# Patient Record
Sex: Male | Born: 1975
Health system: Southern US, Community
[De-identification: ages and names within clinical notes are randomized; demographics above are authoritative.]

## PROBLEM LIST (undated history)

## (undated) DIAGNOSIS — N5089 Other specified disorders of the male genital organs: Secondary | ICD-10-CM

## (undated) DIAGNOSIS — C6290 Malignant neoplasm of unspecified testis, unspecified whether descended or undescended: Secondary | ICD-10-CM

## (undated) DIAGNOSIS — Z923 Personal history of irradiation: Secondary | ICD-10-CM

## (undated) DIAGNOSIS — K219 Gastro-esophageal reflux disease without esophagitis: Secondary | ICD-10-CM

## (undated) DIAGNOSIS — T7840XA Allergy, unspecified, initial encounter: Secondary | ICD-10-CM

## (undated) HISTORY — DX: Malignant neoplasm of unspecified testis, unspecified whether descended or undescended: C62.90

## (undated) HISTORY — DX: Personal history of irradiation: Z92.3

## (undated) HISTORY — DX: Allergy, unspecified, initial encounter: T78.40XA

## (undated) HISTORY — PX: FRACTURE SURGERY: SHX138

---

## 2007-10-02 ENCOUNTER — Encounter: Admission: RE | Admit: 2007-10-02 | Discharge: 2007-10-02 | Payer: Self-pay | Admitting: Orthopedic Surgery

## 2007-10-03 ENCOUNTER — Ambulatory Visit (HOSPITAL_COMMUNITY): Admission: RE | Admit: 2007-10-03 | Discharge: 2007-10-04 | Payer: Self-pay | Admitting: Orthopedic Surgery

## 2007-10-03 HISTORY — PX: OTHER SURGICAL HISTORY: SHX169

## 2011-01-10 NOTE — Op Note (Signed)
Clifford Davis, Clifford Davis                ACCOUNT NO.:  [id number]   MEDICAL RECORD NO.:  [id number]          PATIENT TYPE:  OIB   LOCATION:  5028                         FACILITY:  [facility]   PHYSICIAN:  Madelynn Done, MD  DATE OF BIRTH:  1976/05/09   DATE OF PROCEDURE:  10/03/2007  DATE OF DISCHARGE:  10/04/2007                               OPERATIVE REPORT   PREOPERATIVE DIAGNOSIS:  Left wrist intraarticular distal radius  fracture four or more fragments.   POSTOPERATIVE DIAGNOSIS:  Left wrist intraarticular distal radius  fracture four or more fragments.   ATTENDING SURGEON:  Sharma Covert IV, MD, who was scrubbed and present  for the entire procedure.   ASSISTANT SURGEON:  None.   PROCEDURE:  1. Open treatment of left intraarticular distal radius fracture four      or more fragments with internal fixation, volar plate.  2. Radiograph three-views left wrist, stress radiography.   SURGICAL IMPLANTS:  Acumed volar distal radius plate with locking pegs  distally and three bicortical screws proximally.   SURGICAL TOURNIQUET TIME:  1 hour and 15 minutes at 250 mmHg.   DRAINS:  One #7 TLS drain.   ANESTHESIA:  General via laryngeal mask airway.   SURGICAL INDICATIONS:  Mr. Nan is a 35 year old right hand dominant  gentleman who fell while at work and sustained a displaced articular  distal radius fracture.  The patient was seen in the office and a  preoperative CT scan was obtained which showed the four or more  fragments and fracture pattern.  After review of the CT scan and  preoperative finding, he elected to undergo the above procedure.  The  risks, benefits and alternatives were discussed in detail with the  patient and a signed informed consent was obtained.   DESCRIPTION OF THE PROCEDURE:  The patient was properly identified in  the preoperative holding area and a permanent mark was made on the left  wrist to indicate the correct operative site.  The patient was  then  brought back to the operating room, placed supine on the anesthesia room  table, general anesthesia was administered via LMA.  A well padded  tourniquet was then placed on sealed with a 1000 drape.  Preoperative  antibiotics were given.  The left upper extremity was then prepped and  draped in normal sterile fashion.  A time-out was called, the correct  site was identified and the procedure was then begun.  Using Esmarch  exsanguination and elevation the tourniquet was then insufflated to 250  mmHg.  A longitudinal incision was then made directly over the FCR  tendon.  Dissection was carried down through the skin and subcutaneous  tissues to identify the FCR.  The FCR was then identified and opened and  released both proximally and distally.  The FPL was then identified and  then through blunt dissection the pronator quadratus elevated and an L-  shaped pronator flap was then created.  This allowed for exposure of the  fracture site.  An open reduction was then performed.  Following open  reduction images were  obtained which confirmed good anatomical position  of the intraarticular fragments.  Following the open reduction the volar  plate was then fixed to the distal radius using the oblong hole  proximally with a bicortical screw.  The 3/5 bicortical screw and the  plate were then adjusted in both the AP & lateral planes for appropriate  alignment of the implant.  After the plate was held in place attention  was then turned distally beginning ulnarly where the ulnar column was  then fixed with locking pegs after the appropriate drill bit and guide.  This was confirmed using the mini C-arm and then the remaining distal  locking pegs were then placed moving from an ulnar to radial direction.  Following placement of the distal fixation, attention was then turned  proximally where two more 3/5 bicortical screws were then placed.  There  was good fixation of all three screws.  Following  the placement of the  implant, final mini C-arm images were obtained which confirmed good  anatomical reduction of the distal radius and plate placement.  Following this the wound was then thoroughly irrigated.  The pronator  quadratus flap was then closed with #2-0 Vicryl suture with good layer  closure of the pronator flap.  The subcutaneous tissues were then closed  with #4-0 Vicryl and the skin closed with a horizontal mattress #4-0  nylon suture.  The drain was then placed along the region of the  pronator and closed over the sutures.  16 mL of 0.5% Marcaine were  infiltrated around the wound for a local field block.  Adaptic dressing,  a sterile compressive dressing and a well padded sugar-tong splint were  then applied.  The patient was then extubated and taken to the recovery  room in good condition.   Intraoperative radiographs, three views of the wrist, were obtained  which did show the volar plate fixation in place, appears to be good  alignment in all three planes.  There does not appear to be any  prominent hardware.   Intraoperative stress testing was then done which did not show any  instability of the distal radial joint after fixation of the distal  radius and no widening of the intercarpal ligaments with stress testing.   POSTOPERATIVE PLAN:  The patient is going to be admitted over night for  antibiotics and pain control, be then discharged in the morning and be  seen back in the office in approximately 12 days for wound check and  suture removal, radiographs at that point and then likely transition to  a cast, likely a total of 4 weeks immobilization in a short-arm cast and  then begin therapy for active motion of the wrists and digits and  forearm.      Madelynn Done, MD  Electronically Signed     FWO/MEDQ  D:  10/03/2007  T:  10/04/2007  Job:  [id number]

## 2011-05-19 LAB — CBC
HCT: 46.7
MCV: 89.5
Platelets: 301
WBC: 10.5

## 2011-05-19 LAB — COMPREHENSIVE METABOLIC PANEL
Albumin: 4.3
BUN: 5 — ABNORMAL LOW
Chloride: 103
Creatinine, Ser: 0.58
GFR calc non Af Amer: >60
Total Bilirubin: 0.9

## 2012-11-28 ENCOUNTER — Ambulatory Visit (HOSPITAL_COMMUNITY)
Admission: RE | Admit: 2012-11-28 | Discharge: 2012-11-28 | Disposition: A | Discharge status: Home / Self Care | Payer: 59 | Source: Ambulatory Visit | Attending: Urology | Admitting: Urology

## 2012-11-28 ENCOUNTER — Other Ambulatory Visit (HOSPITAL_COMMUNITY): Payer: Self-pay | Admitting: Urology

## 2012-11-28 DIAGNOSIS — D4959 Neoplasm of unspecified behavior of other genitourinary organ: Secondary | ICD-10-CM

## 2012-11-28 DIAGNOSIS — C6290 Malignant neoplasm of unspecified testis, unspecified whether descended or undescended: Secondary | ICD-10-CM | POA: Insufficient documentation

## 2012-11-29 ENCOUNTER — Encounter (HOSPITAL_BASED_OUTPATIENT_CLINIC_OR_DEPARTMENT_OTHER): Payer: Self-pay | Admitting: *Deleted

## 2012-11-29 ENCOUNTER — Other Ambulatory Visit: Payer: Self-pay | Admitting: Urology

## 2012-11-29 NOTE — Progress Notes (Signed)
NPO AFTER MN WITH EXCEPTION CLEAR LIQUIDS UNTIL 0730 (NO CREAM/ MILK PRODUCTS). ARRIVES AT 1330. NEEDS HG. WILL TAKE ZANTAC AND ZYRTEC AM OF SURG W/ SIPS OF WATER.

## 2012-12-02 ENCOUNTER — Ambulatory Visit (HOSPITAL_BASED_OUTPATIENT_CLINIC_OR_DEPARTMENT_OTHER): Payer: 59 | Admitting: Anesthesiology

## 2012-12-02 ENCOUNTER — Ambulatory Visit (HOSPITAL_BASED_OUTPATIENT_CLINIC_OR_DEPARTMENT_OTHER)
Admission: RE | Admit: 2012-12-02 | Discharge: 2012-12-02 | Disposition: A | Discharge status: Home / Self Care | Payer: 59 | Source: Ambulatory Visit | Attending: Urology | Admitting: Urology

## 2012-12-02 ENCOUNTER — Encounter (HOSPITAL_BASED_OUTPATIENT_CLINIC_OR_DEPARTMENT_OTHER): Payer: Self-pay | Admitting: Anesthesiology

## 2012-12-02 ENCOUNTER — Encounter (HOSPITAL_BASED_OUTPATIENT_CLINIC_OR_DEPARTMENT_OTHER)
Admission: RE | Disposition: A | Discharge status: Home / Self Care | Payer: Self-pay | Source: Ambulatory Visit | Attending: Urology

## 2012-12-02 ENCOUNTER — Encounter (HOSPITAL_BASED_OUTPATIENT_CLINIC_OR_DEPARTMENT_OTHER): Payer: Self-pay

## 2012-12-02 DIAGNOSIS — C6290 Malignant neoplasm of unspecified testis, unspecified whether descended or undescended: Secondary | ICD-10-CM

## 2012-12-02 DIAGNOSIS — K219 Gastro-esophageal reflux disease without esophagitis: Secondary | ICD-10-CM | POA: Insufficient documentation

## 2012-12-02 DIAGNOSIS — N5089 Other specified disorders of the male genital organs: Secondary | ICD-10-CM

## 2012-12-02 HISTORY — DX: Malignant neoplasm of unspecified testis, unspecified whether descended or undescended: C62.90

## 2012-12-02 HISTORY — DX: Other specified disorders of the male genital organs: N50.89

## 2012-12-02 HISTORY — PX: ORCHIECTOMY: SHX2116

## 2012-12-02 HISTORY — DX: Gastro-esophageal reflux disease without esophagitis: K21.9

## 2012-12-02 LAB — POCT HEMOGLOBIN-HEMACUE: Hemoglobin: 17.7 g/dL — ABNORMAL HIGH (ref 13.0–17.0)

## 2012-12-02 SURGERY — ORCHIECTOMY
Anesthesia: General | Site: Groin | Laterality: Left | Wound class: Clean

## 2012-12-02 MED ORDER — PROPOFOL 10 MG/ML IV BOLUS
INTRAVENOUS | Status: DC | PRN
  Administered 2012-12-02: 20 mg via INTRAVENOUS
  Administered 2012-12-02: 300 mg via INTRAVENOUS
  Administered 2012-12-02: 20 mg via INTRAVENOUS

## 2012-12-02 MED ORDER — FENTANYL CITRATE 0.05 MG/ML IJ SOLN
25.0000 ug | INTRAMUSCULAR | Status: DC | PRN
  Administered 2012-12-02 (×4): 25 ug via INTRAVENOUS
  Filled 2012-12-02: qty 1

## 2012-12-02 MED ORDER — BUPIVACAINE HCL (PF) 0.25 % IJ SOLN
INTRAMUSCULAR | Status: DC | PRN
  Administered 2012-12-02: 20 mL

## 2012-12-02 MED ORDER — PROMETHAZINE HCL 25 MG/ML IJ SOLN
6.2500 mg | INTRAMUSCULAR | Status: DC | PRN
  Filled 2012-12-02: qty 1

## 2012-12-02 MED ORDER — MEPERIDINE HCL 25 MG/ML IJ SOLN
6.2500 mg | INTRAMUSCULAR | Status: DC | PRN
  Filled 2012-12-02: qty 1

## 2012-12-02 MED ORDER — DEXAMETHASONE SODIUM PHOSPHATE 4 MG/ML IJ SOLN
INTRAMUSCULAR | Status: DC | PRN
  Administered 2012-12-02: 10 mg via INTRAVENOUS

## 2012-12-02 MED ORDER — LACTATED RINGERS IV SOLN
INTRAVENOUS | Status: DC
  Filled 2012-12-02: qty 1000

## 2012-12-02 MED ORDER — SENNOSIDES-DOCUSATE SODIUM 8.6-50 MG PO TABS: 1.0000 | ORAL_TABLET | Freq: Two times a day (BID) | ORAL | Status: DC

## 2012-12-02 MED ORDER — LACTATED RINGERS IV SOLN
INTRAVENOUS | Status: DC | PRN
  Administered 2012-12-02: 14:00:00 via INTRAVENOUS

## 2012-12-02 MED ORDER — LIDOCAINE HCL (CARDIAC) 20 MG/ML IV SOLN
INTRAVENOUS | Status: DC | PRN
  Administered 2012-12-02: 75 mg via INTRAVENOUS

## 2012-12-02 MED ORDER — CEFAZOLIN SODIUM-DEXTROSE 2-3 GM-% IV SOLR
INTRAVENOUS | Status: DC | PRN
  Administered 2012-12-02: 2 g via INTRAVENOUS

## 2012-12-02 MED ORDER — OXYCODONE-ACETAMINOPHEN 5-325 MG PO TABS: 1.0000 | ORAL_TABLET | ORAL | Status: DC | PRN

## 2012-12-02 MED ORDER — ONDANSETRON HCL 4 MG/2ML IJ SOLN
INTRAMUSCULAR | Status: DC | PRN
  Administered 2012-12-02: 4 mg via INTRAVENOUS

## 2012-12-02 MED ORDER — LACTATED RINGERS IV SOLN
INTRAVENOUS | Status: DC
  Administered 2012-12-02 (×2): via INTRAVENOUS
  Administered 2012-12-02: 100 mL/h via INTRAVENOUS
  Administered 2012-12-02: 14:00:00 via INTRAVENOUS
  Filled 2012-12-02: qty 1000

## 2012-12-02 MED ORDER — OXYCODONE HCL 5 MG PO TABS
10.0000 mg | ORAL_TABLET | Freq: Once | ORAL | Status: AC
  Administered 2012-12-02: 10 mg via ORAL
  Filled 2012-12-02: qty 2

## 2012-12-02 MED ORDER — ACETAMINOPHEN 10 MG/ML IV SOLN
INTRAVENOUS | Status: DC | PRN
  Administered 2012-12-02: 1000 mg via INTRAVENOUS

## 2012-12-02 MED ORDER — KETOROLAC TROMETHAMINE 30 MG/ML IJ SOLN
INTRAMUSCULAR | Status: DC | PRN
  Administered 2012-12-02: 30 mg via INTRAVENOUS

## 2012-12-02 MED ORDER — MIDAZOLAM HCL 5 MG/5ML IJ SOLN
INTRAMUSCULAR | Status: DC | PRN
  Administered 2012-12-02: 2 mg via INTRAVENOUS

## 2012-12-02 MED ORDER — CEFAZOLIN SODIUM-DEXTROSE 2-3 GM-% IV SOLR
2.0000 g | INTRAVENOUS | Status: DC
  Filled 2012-12-02: qty 50

## 2012-12-02 MED ORDER — FENTANYL CITRATE 0.05 MG/ML IJ SOLN
INTRAMUSCULAR | Status: DC | PRN
  Administered 2012-12-02: 50 ug via INTRAVENOUS
  Administered 2012-12-02 (×3): 25 ug via INTRAVENOUS
  Administered 2012-12-02: 50 ug via INTRAVENOUS
  Administered 2012-12-02 (×7): 25 ug via INTRAVENOUS
  Administered 2012-12-02: 50 ug via INTRAVENOUS

## 2012-12-02 SURGICAL SUPPLY — 42 items
BANDAGE CO FLEX L/F 2IN X 5YD (GAUZE/BANDAGES/DRESSINGS) ×2 IMPLANT
BANDAGE CONFORM 3  STR LF (GAUZE/BANDAGES/DRESSINGS) ×2 IMPLANT
BANDAGE GAUZE ELAST BULKY 4 IN (GAUZE/BANDAGES/DRESSINGS) ×2 IMPLANT
BLADE HEX COATED 2.75 (ELECTRODE) ×2 IMPLANT
BLADE SURG 10 STRL SS (BLADE) IMPLANT
BLADE SURG 15 STRL LF DISP TIS (BLADE) ×1 IMPLANT
BLADE SURG 15 STRL SS (BLADE) ×1
CLOTH BEACON ORANGE TIMEOUT ST (SAFETY) ×2 IMPLANT
COVER MAYO STAND STRL (DRAPES) ×2 IMPLANT
COVER TABLE BACK 60X90 (DRAPES) ×2 IMPLANT
DERMABOND ADVANCED (GAUZE/BANDAGES/DRESSINGS) ×1
DERMABOND ADVANCED .7 DNX12 (GAUZE/BANDAGES/DRESSINGS) ×1 IMPLANT
DRAIN PENROSE 18X1/2 LTX STRL (DRAIN) ×2 IMPLANT
DRAIN PENROSE 18X1/4 LTX STRL (WOUND CARE) IMPLANT
DRAPE PED LAPAROTOMY (DRAPES) ×2 IMPLANT
ELECT REM PT RETURN 9FT ADLT (ELECTROSURGICAL) ×2
ELECTRODE REM PT RTRN 9FT ADLT (ELECTROSURGICAL) ×1 IMPLANT
GLOVE BIO SURGEON STRL SZ7 (GLOVE) ×8 IMPLANT
GLOVE BIOGEL PI IND STRL 7.5 (GLOVE) ×1 IMPLANT
GLOVE BIOGEL PI INDICATOR 7.5 (GLOVE) ×1
GLOVE ECLIPSE 7.5 STRL STRAW (GLOVE) ×2 IMPLANT
GOWN PREVENTION PLUS LG XLONG (DISPOSABLE) ×2 IMPLANT
GOWN PREVENTION PLUS XLARGE (GOWN DISPOSABLE) ×6 IMPLANT
NEEDLE HYPO 22GX1.5 SAFETY (NEEDLE) ×2 IMPLANT
NS IRRIG 500ML POUR BTL (IV SOLUTION) ×2 IMPLANT
PACK BASIN DAY SURGERY FS (CUSTOM PROCEDURE TRAY) ×2 IMPLANT
PENCIL BUTTON HOLSTER BLD 10FT (ELECTRODE) ×2 IMPLANT
SPONGE LAP 4X18 X RAY DECT (DISPOSABLE) IMPLANT
SUPPORT SCROTAL LG STRP (MISCELLANEOUS) ×2 IMPLANT
SUT CHROMIC 2 0 SH (SUTURE) IMPLANT
SUT CHROMIC 3 0 SH 27 (SUTURE) ×2 IMPLANT
SUT MNCRL AB 4-0 PS2 18 (SUTURE) ×2 IMPLANT
SUT SILK 0 TIES 10X30 (SUTURE) ×2 IMPLANT
SUT SILK 2 0 SH CR/8 (SUTURE) ×2 IMPLANT
SUT VIC AB 2-0 SH 27 (SUTURE) ×1
SUT VIC AB 2-0 SH 27XBRD (SUTURE) ×1 IMPLANT
SUT VIC AB 4-0 SH 27 (SUTURE) ×1
SUT VIC AB 4-0 SH 27XANBCTRL (SUTURE) ×1 IMPLANT
SYR CONTROL 10ML LL (SYRINGE) ×2 IMPLANT
TOWEL OR 17X24 6PK STRL BLUE (TOWEL DISPOSABLE) ×4 IMPLANT
TRAY DSU PREP LF (CUSTOM PROCEDURE TRAY) ×2 IMPLANT
WATER STERILE IRR 500ML POUR (IV SOLUTION) IMPLANT

## 2012-12-02 NOTE — Anesthesia Procedure Notes (Signed)
Procedure Name: LMA Insertion Date/Time: 12/02/2012 2:31 PM Performed by: Fran Lowes Pre-anesthesia Checklist: Patient identified, Emergency Drugs available, Suction available and Patient being monitored Patient Re-evaluated:Patient Re-evaluated prior to inductionOxygen Delivery Method: Circle System Utilized Preoxygenation: Pre-oxygenation with 100% oxygen Intubation Type: IV induction Ventilation: Mask ventilation without difficulty LMA: LMA inserted LMA Size: 4.0 Number of attempts: 1 Airway Equipment and Method: bite block Placement Confirmation: positive ETCO2 Tube secured with: Tape Dental Injury: Teeth and Oropharynx as per pre-operative assessment

## 2012-12-02 NOTE — Transfer of Care (Signed)
Immediate Anesthesia Transfer of Care Note  Patient: Clifford Davis  Procedure(s) Performed: Procedure(s) (LRB): ORCHIECTOMY (Left)  Patient Location: Patient transported to PACU with oxygen via face mask at 4 Liters / Min  Anesthesia Type: General  Level of Consciousness: awake and alert   Airway & Oxygen Therapy: Patient Spontanous Breathing and Patient connected to face mask oxygen  Post-op Assessment: Report given to PACU RN and Post -op Vital signs reviewed and stable  Post vital signs: Reviewed and stable  Dentition: Teeth and oropharynx remain in pre-op condition  Complications: No apparent anesthesia complications

## 2012-12-02 NOTE — Anesthesia Postprocedure Evaluation (Signed)
  Anesthesia Post-op Note  Patient: Clifford Davis  Procedure(s) Performed: Procedure(s) (LRB): ORCHIECTOMY (Left)  Patient Location: PACU  Anesthesia Type: General  Level of Consciousness: awake and alert   Airway and Oxygen Therapy: Patient Spontanous Breathing  Post-op Pain: mild  Post-op Assessment: Post-op Vital signs reviewed, Patient's Cardiovascular Status Stable, Respiratory Function Stable, Patent Airway and No signs of Nausea or vomiting  Last Vitals:  Filed Vitals:   12/02/12 1700  BP: 124/81  Pulse: 91  Temp:   Resp: 16    Post-op Vital Signs: stable   Complications: No apparent anesthesia complications

## 2012-12-02 NOTE — Anesthesia Preprocedure Evaluation (Signed)
Anesthesia Evaluation  Patient identified by MRN, date of birth, ID band Patient awake    Reviewed: Allergy & Precautions, H&P , NPO status , Patient's Chart, lab work & pertinent test results  Airway Mallampati: II TM Distance: >3 FB Neck ROM: Full    Dental no notable dental hx.    Pulmonary neg pulmonary ROS,  breath sounds clear to auscultation  Pulmonary exam normal       Cardiovascular negative cardio ROS  Rhythm:Regular Rate:Normal     Neuro/Psych negative neurological ROS  negative psych ROS   GI/Hepatic negative GI ROS, Neg liver ROS, GERD-  Medicated and Controlled,  Endo/Other  negative endocrine ROS  Renal/GU negative Renal ROS  negative genitourinary   Musculoskeletal negative musculoskeletal ROS (+)   Abdominal   Peds negative pediatric ROS (+)  Hematology negative hematology ROS (+)   Anesthesia Other Findings   Reproductive/Obstetrics negative OB ROS                           Anesthesia Physical Anesthesia Plan  ASA: I  Anesthesia Plan: General   Post-op Pain Management:    Induction: Intravenous  Airway Management Planned: LMA  Additional Equipment:   Intra-op Plan:   Post-operative Plan:   Informed Consent: I have reviewed the patients History and Physical, chart, labs and discussed the procedure including the risks, benefits and alternatives for the proposed anesthesia with the patient or authorized representative who has indicated his/her understanding and acceptance.   Dental advisory given  Plan Discussed with: CRNA  Anesthesia Plan Comments:         Anesthesia Quick Evaluation

## 2012-12-02 NOTE — Op Note (Signed)
Urology Operative Report  Date of Procedure: 12/02/12  Surgeon: Natalia Leatherwood, MD Assistant: None  Preoperative Diagnosis: Left testicular mass Postoperative Diagnosis:  Same  Procedure(s): Left inguinal radical orchiectomy  Estimated blood loss: Minimal  Specimen: Left testicle with spermatic cord  Drains: None  Complications: None  Findings: Large left testicular mass  History of present illness: 37 year old male presented to clinic with a large left testicular mass. He was noted to have elevated hCG and LDH. Ultrasound revealed a heterogenous left testicular mass concerning for cancer. He presents today for left inguinal radical orchiectomy. Staging CT reveals enlarged retroperitoneal lymph nodes.   Procedure in detail: After informed consent was obtained, the patient was taken to the operating room. They were placed in the supine position. SCDs were turned on and in place. IV antibiotics were infused, and general anesthesia was induced. A timeout was performed in which the correct patient, surgical site, and procedure were identified and agreed upon by the team.  The patient was placed in a supine position, making sure to pad all pertinent neurovascular pressure points. The hair was removed from the genitals which were prepped and draped in the usual sterile fashion.  I then was able to locate the pubic tubercle on the left. This was marked with a marking pen. I then placed my finger into the inguinal canal and located the external inguinal ring. A horizontal incision was made approximately 2 cm above the inguinal ring. Dissection was carried down to the fascia using Bovie electrocautery. The fashion was identified I was able to place the back end of a DeBakey forcep into the external canal and I was able to incise the external ring with a scalpel. The ilioinguinal nerve was identified and brushed back medially and the ring was opened further to allow exposure of the left  spermatic cord up to the internal ring. Blunt dissection was then used to dissect completely around the spermatic cord and then a Penrose drain was wrapped around the cord for tourniquet and clamped. Dissection was then carried out inferiorly bluntly to the scrotum. I was able to place gentle pressure on the left scrotum and delivered the testicle through the incision. I then gently swept the tissue between the scrotal wall and the testicle away and identified the gubernaculum. The gubernaculum was ligated with 0 silk ties and then divided with Bovie electrocautery. There was good hemostasis. The testicle was then placed on traction superiorly and a high ligation of the left spermatic cord was achieved by first tying a 0 silk tie around the cord. I then clamped the cord with a hemostat and placed another 0 silk tie proximal to the hemostat. A third free tie was used and finally a 2-0 silk stick tie was used as well. A 0 silk suture was then placed distally and the spermatic cord was divided. The tails to the ties were left long and then the clamp was removed. There was good hemostasis. All details were cut short except for one and then this was placed back in the inguinal canal and pushed back into the retroperitoneum. The external oblique was reapproximated with interrupted 2-0 Vicryl sutures making sure not to incorporate the ilioinguinal nerve. After this was done the scrotum was again inverted to ensure good hemostasis, and there was good hemostasis. The wound was copiously irrigated with sterile normal saline. Next Scarpa's and Camper's fascia was reapproximated using interrupted 2-0 Vicryl sutures. Local injection with lidocaine 20 cc was injected into the skin and incision.  The wound was again irrigated with sterile normal saline. I then closed the skin with a running 4-0 Monocryl. I then placed Dermabond over the incision. A Kling wrap was placed around the scrotum to help maintain hemostasis and prevent  potential space of the left scrotum. This was not placed very tightly in order to prevent ischemia of the left testis. This concluded the procedure. Anesthesia was reversed, and the patient was taken to the PACU in stable condition.  He will followup with me later this week for review of pathology and treatment options.

## 2012-12-02 NOTE — H&P (Signed)
Urology History and Physical Exam  CC: Left testis mass  HPI: 37 year old male presents today with a left testicular mass concerning for testicular cancer. This began acutely on 11/15/12. Scrotal ultrasound 11/19/12 revealed a heterogenous left testicle. There were no concerning findings the portion but more concerning for testicular mass. He had been started on ciprofloxacin and then referred to me. This was not associated with an intentional weight loss. He states he had had some mid lower back pain for 6-8 months. A change in appetite. No urinary symptoms. Laboratory analysis revealed an elevated LDH and hCG; if he was normal. Physical exam today revealed a left testicular mass which was approximately 8-9 cm in size. Staging CT was obtained of the abdomen and pelvis which revealed a large left retroperitoneal lymph node approximate 6 cm in size; there were no other signs of metastatic disease. We reviewed the entire CT report today and I provided him with a copy. A chest x-ray was negative. I shared the findings of the CT with the patient today and provided him a copy of the report. He presents today for left inguinal radical orchiectomy. We have discussed the risks, benefits, alternatives, and likelihood of achieving goals.  PMH: Past Medical History  Diagnosis Date  . Testis mass     LEFT  . GERD (gastroesophageal reflux disease)     PSH: Past Surgical History  Procedure Laterality Date  . Orif left wrist distal radial fx's  10-03-2007    Allergies: No Known Allergies  Medications: No prescriptions prior to admission     Social History: History   Social History  . Marital Status: Married    Spouse Name: N/A    Number of Children: N/A  . Years of Education: N/A   Occupational History  . Not on file.   Social History Main Topics  . Smoking status: Not on file  . Smokeless tobacco: Never Used  . Alcohol Use: 1.8 oz/week    3 Cans of beer per week  . Drug Use: No  .  Sexually Active: Not on file   Other Topics Concern  . Not on file   Social History Narrative  . No narrative on file    Family History: History reviewed. No pertinent family history.  Review of Systems: Positive: None Negative: Chest pain, SOB, or fever..  A further 10 point review of systems was negative except what is listed in the HPI.  Physical Exam: Filed Vitals:   12/02/12 1353  BP: 148/84  Pulse: 98  Temp: 97 F (36.1 C)  Resp: 18    General: No acute distress.  Awake. Head:  Normocephalic.  Atraumatic. ENT:  EOMI.  Mucous membranes moist Neck:  Supple.  No lymphadenopathy. CV:  S1 present. S2 present. Regular rate. Pulmonary: Equal effort bilaterally.  Clear to auscultation bilaterally. Abdomen: Soft.  Non- tender to palpation. Skin:  Normal turgor.  No visible rash. Extremity: No gross deformity of bilateral upper extremities.  No gross deformity of    bilateral lower extremities. Neurologic: Alert. Appropriate mood.  Urethra: No Foley catheter in place.  Orthotopic meatus. Testicles: Descended bilaterally.  Negative right testis mass. Large left testis mass.   Studies:  No results found for this basename: HGB, WBC, PLT,  in the last 72 hours  No results found for this basename: NA, K, CL, CO2, BUN, CREATININE, CALCIUM, MAGNESIUM, GFRNONAA, GFRAA,  in the last 72 hours   No results found for this basename: PT, INR, APTT,  in  the last 72 hours   No components found with this basename: ABG,     Assessment:  Left testis mass concerning for testis cancer.  Plan: To OR for left inguinal radical orchiectomy.

## 2012-12-03 ENCOUNTER — Encounter (HOSPITAL_BASED_OUTPATIENT_CLINIC_OR_DEPARTMENT_OTHER): Payer: Self-pay | Admitting: Urology

## 2012-12-11 ENCOUNTER — Telehealth: Payer: Self-pay | Admitting: Oncology

## 2012-12-11 NOTE — Telephone Encounter (Signed)
S/W PT IN RE NP APPT 4/23 @ 1:30 W/DR. SHADAD REFERRING DR. Margarita Grizzle DX- TESTICULAR SEMINOMA WELCOME PACKET MAILED.

## 2012-12-11 NOTE — Telephone Encounter (Signed)
LVOM FOR PT TO RETURN CALL IN RE TO APPT.  °

## 2012-12-12 ENCOUNTER — Telehealth: Payer: Self-pay | Admitting: Oncology

## 2012-12-12 NOTE — Telephone Encounter (Signed)
C/D 12/12/12 for appt. 12/18/12

## 2012-12-13 ENCOUNTER — Other Ambulatory Visit: Payer: Self-pay | Admitting: Oncology

## 2012-12-13 DIAGNOSIS — C6290 Malignant neoplasm of unspecified testis, unspecified whether descended or undescended: Secondary | ICD-10-CM

## 2012-12-18 ENCOUNTER — Other Ambulatory Visit (HOSPITAL_BASED_OUTPATIENT_CLINIC_OR_DEPARTMENT_OTHER): Payer: 59

## 2012-12-18 ENCOUNTER — Ambulatory Visit: Payer: 59

## 2012-12-18 ENCOUNTER — Ambulatory Visit (HOSPITAL_BASED_OUTPATIENT_CLINIC_OR_DEPARTMENT_OTHER): Payer: 59 | Admitting: Oncology

## 2012-12-18 ENCOUNTER — Encounter: Payer: Self-pay | Admitting: Oncology

## 2012-12-18 ENCOUNTER — Telehealth: Payer: Self-pay | Admitting: Oncology

## 2012-12-18 VITALS — BP 140/89 | HR 102 | Temp 98.3°F | Resp 18 | Ht 73.0 in | Wt 212.4 lb

## 2012-12-18 DIAGNOSIS — C6290 Malignant neoplasm of unspecified testis, unspecified whether descended or undescended: Secondary | ICD-10-CM

## 2012-12-18 LAB — CBC WITH DIFFERENTIAL/PLATELET
BASO%: 0.7 % (ref 0.0–2.0)
Basophils Absolute: 0 10*3/uL (ref 0.0–0.1)
Eosinophils Absolute: 0.2 10*3/uL (ref 0.0–0.5)
HCT: 46.2 % (ref 38.4–49.9)
HGB: 16 g/dL (ref 13.0–17.1)
MONO#: 0.4 10*3/uL (ref 0.1–0.9)
NEUT#: 3.7 10*3/uL (ref 1.5–6.5)
NEUT%: 55.8 % (ref 39.0–75.0)
WBC: 6.6 10*3/uL (ref 4.0–10.3)
lymph#: 2.3 10*3/uL (ref 0.9–3.3)

## 2012-12-18 LAB — COMPREHENSIVE METABOLIC PANEL (CC13)
ALT: 32 U/L (ref 0–55)
BUN: 12.5 mg/dL (ref 7.0–26.0)
CO2: 27 mEq/L (ref 22–29)
Calcium: 9.9 mg/dL (ref 8.4–10.4)
Chloride: 105 mEq/L (ref 98–107)
Creatinine: 1 mg/dL (ref 0.7–1.3)
Glucose: 97 mg/dl (ref 70–99)

## 2012-12-18 NOTE — Progress Notes (Signed)
Note dictated

## 2012-12-18 NOTE — Progress Notes (Signed)
Checked in new pt with no financial concerns. °

## 2012-12-18 NOTE — Telephone Encounter (Signed)
gv and printed appt sched and avs for pt.Marland KitchenMarland KitchenPt scheduled with Dr. Roselind Messier on 5.1 @ 2:00pm

## 2012-12-19 NOTE — Progress Notes (Signed)
CC:   Natalia Leatherwood, MD  REASON FOR CONSULTATION:  Testicular cancer.  HISTORY OF PRESENT ILLNESS:  Mr. Weese is a pleasant 37 year old gentleman currently of [place] where he lived the majority of his life.  He is a gentleman without any significant past medical history. He does have some seasonal allergies, but for the most part, really is rather healthy.  He had been doing very well until he started developing a left testicular mass dating back to March 2014.  Felt it was the painless in nature and not associated with any other symptoms.  He subsequently was seen at urgent care and was referred to [facility].  He got an ultrasound on March 25th which revealed a heterogeneous left testicle without increased blood flow, not concerning for torsion but definitely suspicious for a malignancy.  At that time, he was evaluated by Dr. Jacquelyne Balint and subsequently referred to Dr. Margarita Grizzle.  At that time, tumor markers were drawn and an LDH was elevated at 372, beta hCG was 16, both elevated.  He subsequently underwent a left inguinal radical orchiectomy that was done on 12/02/2012 and he tolerated it very well.  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  His spermatic cord and surgical margins were negative.  Focal lymphovascular invasion was present and the clinical staging was T2 NX at that time.  CT scan images of the abdomen and pelvis done on 11/29/2012 which showed a large heterogeneous left scrotal mass, that was before his orchiectomy, and also noted a conglomerate of enlarged retroperitoneal lymph node on the left inferior to the left renal vein measuring 3.1 x 2.2 cm in transverse image.  There was no suprarenal adenopathy, both kidneys were normal, the rest of the scan was otherwise unremarkable.  He did also have a CT scan of the chest.  CT scan of the chest was done on 12/12/2012, showed no evidence of  any acute finding, no masses or adenopathy.  Repeat tumor markers done on 12/12/2012 showed his LDH had normalized at 187, also his beta hCG down to 4.9, which is within normal range.  Alpha fetoprotein was normal at 4.1.  Based on these findings, patient referred to me for further evaluation. Clinically, Mr. Temme currently is asymptomatic.  He is regaining a lot of his activities of daily living.  He has not returned to work at this time.  He has really not been able to exert himself fully, but able to perform most activities of daily living.  He is not reporting any abdominal pain, is not reporting any constipation, not reporting any genitourinary complaints.  He does report some back discomfort that has been chronic and probably related to his job.  REVIEW OF SYSTEMS:  Does not report any headaches, blurry vision, double vision.  Does not report any motor or sensory neuropathy.  Does not report any alteration in mental status.  Does not report any psychiatric issues, depression.  Does not report any fever, chills, sweats.  Does not report any cough, hemoptysis, hematemesis.  No nausea or vomiting. Does not report any abdominal pain, hematochezia, melena, or genitourinary complaints.  Rest of review of systems is unremarkable.  PAST MEDICAL HISTORY:  Really unremarkable.  No history of hypertension, diabetes, coronary artery disease, or any colitis or inflammatory bowel disease.  He has history of seasonal allergies as well as GERD.  MEDICATION:  He takes Zyrtec as needed daily.  Also takes ranitidine.  ALLERGIES:  None.  FAMILY HISTORY:  His father is on cholesterol medicine.  His mother has asthma.  He has a grandfather who had lung cancer after smoking.  He had an aunt who had stomach cancer.  SOCIAL HISTORY:  He is married.  Does not have any children.  Denied any alcohol or tobacco abuse at this time.  PHYSICAL EXAMINATION:  General:  Alert, awake, a pleasant  gentleman, appeared in no active distress.  Vitals:  Blood pressure of 140/89, pulse is 102, respirations 18, temperature is 98, weighs 212 pounds. ECOG performance status is 0.  HEENT:  Head is normocephalic, atraumatic.  Pupils equal and round, reactive to light.  Oral mucosa moist and pink.  Neck:  Supple.  No lymphadenopathy.  Heart:  Regular rate and rhythm, S1 and S2.  Lungs:  Clear to auscultation.  Abdomen: Soft, nontender.  No hepatosplenomegaly.  Extremities:  No clubbing, cyanosis, or edema.  Neurological:  Intact motor, sensory, and deep tendon reflexes.  LABORATORY DATA:  Showed a hemoglobin of 16, white cell count of 6.6, platelet count of 366.  He has normal liver function tests and electrolytes.  Tumor markers were discussed in the history of present illness.  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  He had presented with a left testicular mass and underwent a radical orchiectomy on 12/02/2012 and CT scans did show an enlarged conglomerate of lymph glands, largest measuring 3.1 x 2.2, just inferior to the left renal vein.  No evidence of any distant metastasis at this time.  I had a lengthy discussion today with Mr. Cutsforth discussing the natural course of testicular cancer, more specifically stage II non- bulky pure seminoma.  I talked to him about the treatment options at this point.  As it stands right now with non-bulky stage II seminoma, radiation therapy to the area is the recommended approach.  I discussed with him the role of systemic chemotherapy.  At this point, really the only effective chemotherapy for stage II seminoma is combination chemotherapy utilizing etoposide and cisplatin for a total of 4 cycles, or etoposide and cisplatin with bleomycin for 3 cycles.  I discussed with him the risks and benefits of this chemotherapy, the logistics associated  with it, and the toxicity associated with it at this time, and at this point, it is felt as long as he does not have bulky disease this combination of chemotherapy, given its toxicity, would be reserved for relapses or for people that would not undergo radiation therapy.  I have explained to him briefly the logistics associated with the administration of radiotherapy that include it will be daily fractions at this point for a period of multiple weeks.  Complications associated with that compared to systemic chemotherapy were outlined.  At this point, I feel that if radiation therapy is doable, I think that would be my first option for him and systemic chemotherapy would be used as salvage.  I quoted him a 90% overall cure rate regardless whichever way he decided to go.  I also explained to him after radiation therapy, or chemotherapy if elected to decline radiation therapy, he will need active surveillance.  I will set him up with radiation therapy followup and evaluation, as well as a followup with me to initiate surveillance plan after he completes the radiation therapy at this time.  All his questions were answered  today.    ______________________________ Benjiman Core, M.D. FNS/MEDQ  D:  12/18/2012  T:  12/19/2012  Job:  [id number]

## 2012-12-21 LAB — AFP TUMOR MARKER: AFP-Tumor Marker: 2.9 ng/mL (ref 0.0–8.0)

## 2012-12-26 ENCOUNTER — Encounter: Payer: Self-pay | Admitting: Radiation Oncology

## 2012-12-26 ENCOUNTER — Ambulatory Visit: Payer: 59

## 2012-12-26 ENCOUNTER — Ambulatory Visit
Admission: RE | Admit: 2012-12-26 | Discharge: 2012-12-26 | Disposition: A | Discharge status: Home / Self Care | Payer: 59 | Source: Ambulatory Visit | Attending: Radiation Oncology | Admitting: Radiation Oncology

## 2012-12-26 ENCOUNTER — Ambulatory Visit: Payer: 59 | Admitting: Radiation Oncology

## 2012-12-26 VITALS — BP 133/71 | HR 98 | Temp 98.8°F | Ht 73.0 in | Wt 213.8 lb

## 2012-12-26 DIAGNOSIS — C6290 Malignant neoplasm of unspecified testis, unspecified whether descended or undescended: Secondary | ICD-10-CM | POA: Insufficient documentation

## 2012-12-26 DIAGNOSIS — C6292 Malignant neoplasm of left testis, unspecified whether descended or undescended: Secondary | ICD-10-CM

## 2012-12-26 NOTE — Progress Notes (Signed)
Radiation Oncology         [phone] ________________________________  Initial outpatient Consultation  Name: Clifford Davis MRN: [id number]  Date: 12/26/2012  DOB: [birth year]  CC:No PCP Per Patient  Benjiman Core, MD ,Natalia Leatherwood, MD  REFERRING PHYSICIAN: Benjiman Core, MD  DIAGNOSIS: Stage II-B (T2, N2, M0) testicular seminoma  HISTORY OF PRESENT ILLNESS::Clifford Davis is a 37 y.o. male who is seen out of the courtesy of Dr. Clelia Croft for an opinion concerning radiation therapy as part of management of patient's recently diagnosed testicular seminoma. Patient presented in March of this year swelling of his left testicle noted with showering. He was seen at urgent care and subsequently referred to [facility]. Ultrasound of the left testicle revealed a heterogeneous mass. Preoperative CT scan of the abdomen and pelvis revealed a conglomerate of enlarged retroperitoneal lymph nodes on the left, just inferior to the left renal vein. This area measured 3.1 x 2.2 cm. Other than the testicular mass no other findings were noted on the CT scan of the abdomen and pelvis. The patient at that time was found to have an  LDH of 372 and a beta hCG of 16, both elevated.  The patient proceeded to undergo a left inguinal radical orchiectomy on 12/02/2012. Pathology from the patient's surgery revealed a seminoma with necrosis. The tumor measured 8.5 cm and was limited to the testis. The resection margins were clear. There was focal lymphovascular space invasion noted. Pathologic staging was pT2, NX.  The patient has done well since his surgery.. A postoperative CT scan of the chest was performed which showed no evidence of adenopathy.  The patient was subsequently referred to Dr. Clelia Croft in medical oncology who presented options for management to the patient. Patient is now seen in radiation oncology for consideration for treatment and discussion of treatment options.   PREVIOUS RADIATION THERAPY:  No  PAST MEDICAL HISTORY:  has a past medical history of Testis mass; GERD (gastroesophageal reflux disease); Testicular carcinoma (12/02/2012); and Allergy.    PAST SURGICAL HISTORY: Past Surgical History  Procedure Laterality Date  . Orif left wrist distal radial fx's  10-03-2007  . Orchiectomy Left 12/02/2012    Procedure: ORCHIECTOMY;  Surgeon: Milford Cage, MD;  Location: [facility];  Service: Urology;  Laterality: Left;  1HR LEFT RADICAL INGUINAL ORCHIECTOMY     FAMILY HISTORY: family history includes Asthma in his mother; Cancer in his maternal aunt and maternal grandfather; and Hyperlipidemia in his father.  SOCIAL HISTORY:  reports that he has never smoked. He has never used smokeless tobacco. He reports that he drinks about 1.8 ounces of alcohol per week. He reports that he does not use illicit drugs.  ALLERGIES: Bee venom  MEDICATIONS:  Current Outpatient Prescriptions  Medication Sig Dispense Refill  . cetirizine (ZYRTEC) 10 MG tablet Take 10 mg by mouth daily.      Marland Kitchen omeprazole (PRILOSEC) 40 MG capsule Take 40 mg by mouth daily.      . ranitidine (ZANTAC) 150 MG tablet Take 150 mg by mouth every morning.       No current facility-administered medications for this encounter.    REVIEW OF SYSTEMS:  A 15 point review of systems is documented in the electronic medical record. This was obtained by the nursing staff. However, I reviewed this with the patient to discuss relevant findings and make appropriate changes.  He had some mild soreness in the left inguinal and groin area. He denies  any difficulty with urination, hematuria or blood in his semen. Patient denies any Flank pain or new low back pain. He denies any cough or breathing problems.   PHYSICAL EXAM:  height is 6\' 1"  (1.854 m) and weight is 213 lb 12.8 oz (96.979 kg). His temperature is 98.8 F (37.1 C). His blood pressure is 133/71 and his pulse is 98.   BP 133/71  Pulse 98  Temp(Src) 98.8  F (37.1 C)  Ht 6\' 1"  (1.854 m)  Wt 213 lb 12.8 oz (96.979 kg)  BMI 28.21 kg/m2  General Appearance:    Alert, cooperative, no distress, appears stated age, accompanied by his wife on evaluation today.   Head:    Normocephalic, without obvious abnormality, atraumatic  Eyes:    PERRL, conjunctiva/corneas clear, EOM's intact, fundi    benign, both eyes       Ears:    Normal TM's and external ear canals, both ears  Nose:   Nares normal, deviated septum noted, mucosa normal, no drainage    or sinus tenderness  Throat:   Lips, mucosa, and tongue normal; teeth and gums normal  Neck:   Supple, symmetrical, trachea midline, no adenopathy;       thyroid:  No enlargement/tenderness/nodules; no carotid   bruit or JVD  Back:     Symmetric, no curvature, ROM normal, no CVA tenderness  Lungs:     Clear to auscultation bilaterally, respirations unlabored  Chest wall:    No tenderness or deformity  Heart:    Regular rate and rhythm,  no murmur, rub   or gallop  Abdomen:     Soft, non-tender, bowel sounds active all four quadrants,    no masses, no organomegaly, small scar in the left inguinal area which is healing well without signs of drainage or infection   Genitalia:    Normal male without lesion, discharge or tenderness, the left testicle is surgically absent. There no palpable masses within the right testicle      Extremities:   Extremities normal, atraumatic, no cyanosis or edema  Pulses:   2+ and symmetric all extremities  Skin:   Skin color, texture, turgor normal, no rashes or lesions, large tattoo along the left forearm   Lymph nodes:   Cervical, supraclavicular, and axillary nodes normal  Neurologic:    Normal strength, sensation and reflexes      throughout    LABORATORY DATA:  Lab Results  Component Value Date   WBC 6.6 12/18/2012   HGB 16.0 12/18/2012   HCT 46.2 12/18/2012   MCV 88.2 12/18/2012   PLT 366 12/18/2012   Lab Results  Component Value Date   NA 142 12/18/2012   K 3.7  12/18/2012   CL 105 12/18/2012   CO2 27 12/18/2012   Lab Results  Component Value Date   ALT 32 12/18/2012   AST 14 12/18/2012   ALKPHOS 81 12/18/2012   BILITOT 0.31 12/18/2012   postop LDH normalized to 187, beta hCG down to 4.9, within normal range, alpha fetoprotein normal at 4.1   RADIOGRAPHY: Dg Chest 2 View  11/28/2012  *RADIOLOGY REPORT*  Clinical Data: Testicular cancer  CHEST - 2 VIEW  Comparison: None.  Findings: Heart size is normal.  Negative for heart failure.  The lungs are free of  infiltrate or effusion.  No mass or lung nodule is identified.  No bony lesions are present.  IMPRESSION: No active cardiopulmonary abnormality.  Negative for metastatic disease.   Original Report Authenticated  By: Janeece Riggers, M.D.       IMPRESSION:  Stage II-B (T2, N2, M0) testicular seminoma, non-bulky.  The patient would be a good candidate for postoperative radiation therapy directed to the left pelvic nodal area and periaortic region. Other options to consider would be chemotherapy which was discussed in detail with the patient by medical oncology. At this time the patient is leaning towards radiation for definitive treatment. I discussed the overall treatment course side effects and potential long-term toxicities of radiation therapy in this situation with the patient and his wife. The patient appears to understand and wishes to proceed with planned course of treatment.  PLAN: Patient will be scheduled for a PET scan for treatment planning purposes. This will aid in boosting any potential areas of seminomatous involvement within the pelvis and periaortic area and to rule out metastatic spread to the chest region. Simulation and planning will follow soon after the patient's PET scan. I spent 60 minutes minutes face to face with the patient and more than 50% of that time was spent in counseling and/or coordination of care.    ------------------------------------------------ -----------------------------------  Billie Lade, PhD, MD

## 2012-12-26 NOTE — Progress Notes (Signed)
37 year old. Male. Married. No children.  Testicular ca. S/p left inguinal radical orchiectomy done 12/02/2012. Pathology revealed pure seminoma of left testicle measuring 8.5 cm. Surgical margins were negative. Focal lymphovascular invasion was present. CT scan of the abdomen and pelvis done 11/29/2012 showed large heterogeneous left scrotal mass and conglomerate of enlarged retroperitoneal lymph node on the left inferior to the left renal vein. Clelia Croft wants to reserve chemo at this point for relapse.   NKDA No hx of radiation therapy Does not have a pacemaker

## 2012-12-27 ENCOUNTER — Telehealth: Payer: Self-pay | Admitting: *Deleted

## 2012-12-27 NOTE — Addendum Note (Signed)
Encounter addended by: Agnes Lawrence, RN on: 12/27/2012  8:26 AM<BR>     Documentation filed: Charges VN

## 2012-12-27 NOTE — Telephone Encounter (Signed)
CALLED PATIENT TO INFORM OF TEST, SPOKE WITH PT. AND HE IS AWARE OF THIS TEST.

## 2012-12-31 ENCOUNTER — Encounter (HOSPITAL_COMMUNITY): Payer: Self-pay

## 2012-12-31 ENCOUNTER — Encounter (HOSPITAL_COMMUNITY)
Admission: RE | Admit: 2012-12-31 | Discharge: 2012-12-31 | Disposition: A | Discharge status: Home / Self Care | Payer: 59 | Source: Ambulatory Visit | Attending: Radiation Oncology | Admitting: Radiation Oncology

## 2012-12-31 DIAGNOSIS — C6290 Malignant neoplasm of unspecified testis, unspecified whether descended or undescended: Secondary | ICD-10-CM

## 2012-12-31 LAB — GLUCOSE, CAPILLARY: Glucose-Capillary: 104 mg/dL — ABNORMAL HIGH (ref 70–99)

## 2012-12-31 MED ORDER — FLUDEOXYGLUCOSE F - 18 (FDG) INJECTION
18.1000 | Freq: Once | INTRAVENOUS | Status: AC | PRN
  Administered 2012-12-31: 18.1 via INTRAVENOUS

## 2013-01-01 ENCOUNTER — Ambulatory Visit
Admission: RE | Admit: 2013-01-01 | Discharge: 2013-01-01 | Disposition: A | Discharge status: Home / Self Care | Payer: 59 | Source: Ambulatory Visit | Attending: Radiation Oncology | Admitting: Radiation Oncology

## 2013-01-01 ENCOUNTER — Encounter: Payer: Self-pay | Admitting: Radiation Oncology

## 2013-01-01 DIAGNOSIS — C6290 Malignant neoplasm of unspecified testis, unspecified whether descended or undescended: Secondary | ICD-10-CM | POA: Insufficient documentation

## 2013-01-01 DIAGNOSIS — R599 Enlarged lymph nodes, unspecified: Secondary | ICD-10-CM | POA: Insufficient documentation

## 2013-01-01 DIAGNOSIS — Z510 Encounter for antineoplastic radiation therapy: Secondary | ICD-10-CM | POA: Insufficient documentation

## 2013-01-01 DIAGNOSIS — R5383 Other fatigue: Secondary | ICD-10-CM | POA: Insufficient documentation

## 2013-01-01 DIAGNOSIS — R5381 Other malaise: Secondary | ICD-10-CM | POA: Insufficient documentation

## 2013-01-01 DIAGNOSIS — C6292 Malignant neoplasm of left testis, unspecified whether descended or undescended: Secondary | ICD-10-CM

## 2013-01-01 NOTE — Progress Notes (Signed)
Met with patient to discuss RO billing.  Had no concerns today.

## 2013-01-02 NOTE — Progress Notes (Signed)
  Radiation Oncology         [phone] ________________________________  Name: Clifford Davis MRN: [id number]  Date: 01/01/2013  DOB: Apr 20, 1976  SIMULATION AND TREATMENT PLANNING NOTE  DIAGNOSIS:  Stage II-B (T2, N2, M0) testicular seminoma   NARRATIVE:  The patient was brought to the CT Simulation planning suite.  Identity was confirmed.  All relevant records and images related to the planned course of therapy were reviewed.  The patient freely provided informed written consent to proceed with treatment after reviewing the details related to the planned course of therapy. The consent form was witnessed and verified by the simulation staff.  Then, the patient was set-up in a stable reproducible  supine position for radiation therapy.  CT images were obtained.  Surface markings were placed.  The CT images were loaded into the planning software.  Then the target and avoidance structures were contoured.  Treatment planning then occurred.  The radiation prescription was entered and confirmed.  Then, I designed and supervised the construction of a total of 3 medically necessary complex treatment devices.  I have requested : 3D Simulation  I have requested a DVH of the following structures: Left and right kidney, GTV, spinal cord.  I have ordered:dose calc and diode to be placed within the testicular shield for dose estimates to the right testicle.  PLAN:  The patient will receive 25 Gy in 20 fractions, followed by a boost to the adenopathy in the periaortic area of 10 Gy for a cumulative dose to this area of 35 Gy.  ________________________________  -----------------------------------  Billie Lade, PhD, MD

## 2013-01-06 ENCOUNTER — Telehealth: Payer: Self-pay | Admitting: *Deleted

## 2013-01-06 NOTE — Telephone Encounter (Signed)
Returned call from pt who is requesting letter from Dr Roselind Messier stating pt's work limitations and restrictions while he is undergoing radiation treatments. He has requested letter be faxed attention to [facility], [phone]. Discussed w/Dr Roselind Messier who states he has no specific limitations or restrictions, but pt needs to stay well hydrated during work while he undergoes radiation treatments. He stated pt needs to speak w/Dr Margarita Grizzle, surgeon re: work limitations, restrictions.  Called pt back and explained above. Pt verbalized understanding. Pt requests letter from Dr Roselind Messier stating he needs to stay well hydrated during working hours while he is undergoing radiation treatments. Sent e-mail to [facility], cc Dr Roselind Messier requesting this letter for pt. Pt begins treatment on 01/09/13.

## 2013-01-07 ENCOUNTER — Ambulatory Visit: Payer: 59

## 2013-01-07 ENCOUNTER — Encounter: Payer: Self-pay | Admitting: Radiation Oncology

## 2013-01-08 ENCOUNTER — Ambulatory Visit: Payer: 59

## 2013-01-08 ENCOUNTER — Ambulatory Visit: Admission: RE | Admit: 2013-01-08 | Payer: 59 | Source: Ambulatory Visit | Admitting: Radiation Oncology

## 2013-01-08 ENCOUNTER — Telehealth: Payer: Self-pay | Admitting: Radiation Oncology

## 2013-01-08 NOTE — Telephone Encounter (Signed)
Faxed letter to [facility], [phone].  Received confirmation.

## 2013-01-09 ENCOUNTER — Ambulatory Visit
Admission: RE | Admit: 2013-01-09 | Discharge: 2013-01-09 | Disposition: A | Discharge status: Home / Self Care | Payer: 59 | Source: Ambulatory Visit | Attending: Radiation Oncology | Admitting: Radiation Oncology

## 2013-01-09 ENCOUNTER — Ambulatory Visit: Payer: 59

## 2013-01-09 MED ORDER — PROCHLORPERAZINE MALEATE 10 MG PO TABS: 10.0000 mg | ORAL_TABLET | Freq: Four times a day (QID) | ORAL | Status: DC | PRN

## 2013-01-09 NOTE — Progress Notes (Signed)
  Radiation Oncology         [phone] ________________________________  Name: Clifford Davis MRN: [id number]  Date: 01/09/2013  DOB: Apr 29, 1976  Simulation Verification Note  Status: outpatient  NARRATIVE: The patient was brought to the treatment unit and placed in the planned treatment position. The clinical setup was verified. Then port films were obtained and uploaded to the radiation oncology medical record software.  The treatment beams were carefully compared against the planned radiation fields. The position location and shape of the radiation fields was reviewed. They targeted volume of tissue appears to be appropriately covered by the radiation beams. Organs at risk appear to be excluded as planned.  Based on my personal review, I approved the simulation verification. The patient's treatment will proceed as planned.  -----------------------------------  Billie Lade, PhD, MD

## 2013-01-10 ENCOUNTER — Ambulatory Visit: Payer: 59

## 2013-01-10 ENCOUNTER — Ambulatory Visit
Admission: RE | Admit: 2013-01-10 | Discharge: 2013-01-10 | Disposition: A | Discharge status: Home / Self Care | Payer: 59 | Source: Ambulatory Visit | Attending: Radiation Oncology | Admitting: Radiation Oncology

## 2013-01-13 ENCOUNTER — Ambulatory Visit
Admission: RE | Admit: 2013-01-13 | Discharge: 2013-01-13 | Disposition: A | Discharge status: Home / Self Care | Payer: 59 | Source: Ambulatory Visit | Attending: Radiation Oncology | Admitting: Radiation Oncology

## 2013-01-13 ENCOUNTER — Ambulatory Visit: Payer: 59

## 2013-01-13 DIAGNOSIS — C6290 Malignant neoplasm of unspecified testis, unspecified whether descended or undescended: Secondary | ICD-10-CM

## 2013-01-13 DIAGNOSIS — C6292 Malignant neoplasm of left testis, unspecified whether descended or undescended: Secondary | ICD-10-CM

## 2013-01-13 MED ORDER — RADIAPLEXRX EX GEL
Freq: Once | CUTANEOUS | Status: AC
  Administered 2013-01-13: 19:00:00 via TOPICAL

## 2013-01-13 MED ORDER — RADIAPLEXRX EX GEL: Freq: Once | CUTANEOUS | Status: DC

## 2013-01-13 NOTE — Progress Notes (Addendum)
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  Stressed importance of notifying the nurses and physicians of any symptoms and or pain during the treatment process.  Given copy of foods that are easy on the stomach from the [employer].  He and his spouse have reviewed the materials and he stated he did not have any further questions.  Given Radialex gel to apply to Abdomen and back with instructions to apply in the morning and at bedtime since his treatment is late afternoon.  He stated understanding.  Thus far he has received 3 fractions to his abdomen and pelvis.  He has a copy of this RN's business card and knows how to contact her and his physician during the day, after hours and on the weekend.  He currently has an antiemetic prescription if needed.

## 2013-01-14 ENCOUNTER — Ambulatory Visit
Admission: RE | Admit: 2013-01-14 | Discharge: 2013-01-14 | Disposition: A | Discharge status: Home / Self Care | Payer: 59 | Source: Ambulatory Visit | Attending: Radiation Oncology | Admitting: Radiation Oncology

## 2013-01-14 VITALS — BP 117/78 | HR 83 | Temp 98.2°F | Ht 73.0 in | Wt 210.6 lb

## 2013-01-14 DIAGNOSIS — C6292 Malignant neoplasm of left testis, unspecified whether descended or undescended: Secondary | ICD-10-CM

## 2013-01-14 NOTE — Progress Notes (Signed)
[facility]    Radiation Oncology [address] [place]     Maryln Gottron, M.D. [place], [place] [address]               Billie Lade, M.D., Ph.D. Phone: [phone]      Molli Hazard A. Kathrynn Running, M.D. Fax: [phone]      Radene Gunning, M.D., Ph.D.         Lurline Hare, M.D.         Grayland Jack, M.D Weekly Treatment Management Note  Name: Clifford Davis     MRN: [id number]        CSN: [id number] Date: 01/14/2013      DOB: 1975/11/03  CC: No PCP Per Patient         Clelia Croft    Status: Outpatient  Diagnosis: The encounter diagnosis was Primary testicular seminoma, left.  Current Dose: 5.0 Gy  Current Fraction: 4  Planned Dose: 35 Gy  Narrative: [name] was seen today for weekly treatment management. The chart was checked and CBCT  were reviewed. He is tolerating his radiation therapy well. Last night he had some mild nausea was controlled well with Compazine. He denies any significant cramping or other issues at this time.  He continues to work his usual schedule.  Bee venom  Current Outpatient Prescriptions  Medication Sig Dispense Refill  . cetirizine (ZYRTEC) 10 MG tablet Take 10 mg by mouth daily.      Marland Kitchen omeprazole (PRILOSEC) 40 MG capsule Take 40 mg by mouth daily.      . prochlorperazine (COMPAZINE) 10 MG tablet Take 1 tablet (10 mg total) by mouth every 6 (six) hours as needed.  30 tablet  0  . ranitidine (ZANTAC) 150 MG tablet Take 150 mg by mouth every morning.       No current facility-administered medications for this encounter.   Labs:  Lab Results  Component Value Date   WBC 6.6 12/18/2012   HGB 16.0 12/18/2012   HCT 46.2 12/18/2012   MCV 88.2 12/18/2012   PLT 366 12/18/2012   Lab Results  Component Value Date   CREATININE 1.0 12/18/2012   BUN 12.5 12/18/2012   NA 142 12/18/2012   K 3.7 12/18/2012   CL 105 12/18/2012   CO2 27 12/18/2012   Lab Results  Component Value Date   ALT 32 12/18/2012   AST 14 12/18/2012   BILITOT 0.31  12/18/2012    Physical Examination:  height is 6\' 1"  (1.854 m) and weight is 210 lb 9.6 oz (95.528 kg). His temperature is 98.2 F (36.8 C). His blood pressure is 117/78 and his pulse is 83.    Wt Readings from Last 3 Encounters:  01/14/13 210 lb 9.6 oz (95.528 kg)  12/26/12 213 lb 12.8 oz (96.979 kg)  12/18/12 212 lb 6.4 oz (96.344 kg)     Lungs - Normal respiratory effort, chest expands symmetrically. Lungs are clear to auscultation, no crackles or wheezes.  Heart has regular rhythm and rate  Abdomen is soft and non tender with normal bowel sounds  Assessment:  Patient tolerating treatments well  Plan: Continue treatment per original radiation prescription

## 2013-01-14 NOTE — Progress Notes (Signed)
Mr. Colson here for weekly under treat visit.  He has had 4/20 fractions to his abdomen/pelvis.  He denies pain and fatigue.  He did have nausea last night and took a compazine which helped.  He denies bowel or bladder changes.

## 2013-01-15 ENCOUNTER — Ambulatory Visit
Admission: RE | Admit: 2013-01-15 | Discharge: 2013-01-15 | Disposition: A | Discharge status: Home / Self Care | Payer: 59 | Source: Ambulatory Visit | Attending: Radiation Oncology | Admitting: Radiation Oncology

## 2013-01-16 ENCOUNTER — Ambulatory Visit
Admission: RE | Admit: 2013-01-16 | Discharge: 2013-01-16 | Disposition: A | Discharge status: Home / Self Care | Payer: 59 | Source: Ambulatory Visit | Attending: Radiation Oncology | Admitting: Radiation Oncology

## 2013-01-17 ENCOUNTER — Ambulatory Visit
Admission: RE | Admit: 2013-01-17 | Discharge: 2013-01-17 | Disposition: A | Discharge status: Home / Self Care | Payer: 59 | Source: Ambulatory Visit | Attending: Radiation Oncology | Admitting: Radiation Oncology

## 2013-01-21 ENCOUNTER — Ambulatory Visit
Admission: RE | Admit: 2013-01-21 | Discharge: 2013-01-21 | Disposition: A | Discharge status: Home / Self Care | Payer: 59 | Source: Ambulatory Visit | Attending: Radiation Oncology | Admitting: Radiation Oncology

## 2013-01-21 VITALS — BP 122/76 | HR 67 | Temp 98.5°F | Ht 73.0 in | Wt 212.7 lb

## 2013-01-21 DIAGNOSIS — C6292 Malignant neoplasm of left testis, unspecified whether descended or undescended: Secondary | ICD-10-CM

## 2013-01-21 NOTE — Progress Notes (Signed)
Clifford Davis here for weekly under treat visit.  He has had 8/20 fractions to his abdomen/pelvis.  He denies pain.  He did have nausea last week.  He does feel fatigued.  He denies bowel or bladder changes.

## 2013-01-21 NOTE — Progress Notes (Signed)
  Radiation Oncology         [phone] ________________________________  Name: Clifford Davis MRN: [id number]  Date: 01/21/2013  DOB: 1975-10-13  Weekly Radiation Therapy Management  Current Dose: 10 Gy     Planned Dose:  35 Gy  Narrative . . . . . . . . The patient presents for routine under treatment assessment.                                                     The patient is without complaint.  Last week he did have one episode of nausea but none since. He is noticing fatigue with his work. He is careful to be well hydrated and to limit his physical activity as much as possible.                                 Set-up films were reviewed.                                 The chart was checked. Physical Findings. . .  height is 6\' 1"  (1.854 m) and weight is 212 lb 11.2 oz (96.48 kg). His temperature is 98.5 F (36.9 C). His blood pressure is 122/76 and his pulse is 67. . Weight essentially stable.  No significant changes. Impression . . . . . . . The patient is  tolerating radiation. Plan . . . . . . . . . . . . Continue treatment as planned.  ________________________________  -----------------------------------  Billie Lade, PhD, MD

## 2013-01-22 ENCOUNTER — Ambulatory Visit
Admission: RE | Admit: 2013-01-22 | Discharge: 2013-01-22 | Disposition: A | Discharge status: Home / Self Care | Payer: 59 | Source: Ambulatory Visit | Attending: Radiation Oncology | Admitting: Radiation Oncology

## 2013-01-22 ENCOUNTER — Encounter: Payer: Self-pay | Admitting: Radiation Oncology

## 2013-01-22 NOTE — Progress Notes (Signed)
   Department of Radiation Oncology  Phone:  [phone] Fax:        [phone]  Simulation note  Today the patient underwent additional planning for radiation therapy directed at the periaortic region. The patient's treatment planning CT scan was reviewed and he had set up of a boost field directed at the enlarged lymph nodes.  The patient had set up of 4 dynamic conformal arcs using a 3-D plan. 6 MV photons will be used to deliver the patient's treatment. The patient will receive 5 additional treatments at 2 gray per treatment for an additional dose of 10 gray.  -----------------------------------  Billie Lade, PhD, MD

## 2013-01-23 ENCOUNTER — Ambulatory Visit
Admission: RE | Admit: 2013-01-23 | Discharge: 2013-01-23 | Disposition: A | Discharge status: Home / Self Care | Payer: 59 | Source: Ambulatory Visit | Attending: Radiation Oncology | Admitting: Radiation Oncology

## 2013-01-24 ENCOUNTER — Telehealth: Payer: Self-pay | Admitting: *Deleted

## 2013-01-24 ENCOUNTER — Ambulatory Visit
Admission: RE | Admit: 2013-01-24 | Discharge: 2013-01-24 | Disposition: A | Discharge status: Home / Self Care | Payer: 59 | Source: Ambulatory Visit | Attending: Radiation Oncology | Admitting: Radiation Oncology

## 2013-01-24 NOTE — Telephone Encounter (Signed)
Pt's wife Denny Peon called stating pt is getting "angry, depressed, and is exhausted from working full time out in the sun". She verbalizes being very concerned about him. She states pt told her he was going to talk to rad onc nurse today, but wife feels pt may not carry through. She is asking for help, for nurse to talk w/pt today. Informed her this nurse will be out of office at that time, but will give this request to Elnita Maxwell and Clydie Braun RN that will be in office this afternoon. Also advised her this RN will call SW and have them FU with pt re: distress screen he filled out on new consult day. Wife agrees w/above plan.

## 2013-01-27 ENCOUNTER — Ambulatory Visit
Admission: RE | Admit: 2013-01-27 | Discharge: 2013-01-27 | Disposition: A | Discharge status: Home / Self Care | Payer: 59 | Source: Ambulatory Visit | Attending: Radiation Oncology | Admitting: Radiation Oncology

## 2013-01-27 ENCOUNTER — Telehealth: Payer: Self-pay | Admitting: *Deleted

## 2013-01-27 NOTE — Telephone Encounter (Signed)
Attempted to reach pt's wife to FU on phone conversation on 01/24/13; no answer, no voice mail. Will try to reach Ms Lich tomorrow.

## 2013-01-28 ENCOUNTER — Ambulatory Visit
Admission: RE | Admit: 2013-01-28 | Discharge: 2013-01-28 | Disposition: A | Discharge status: Home / Self Care | Payer: 59 | Source: Ambulatory Visit | Attending: Radiation Oncology | Admitting: Radiation Oncology

## 2013-01-28 VITALS — BP 121/78 | HR 78 | Temp 98.1°F | Ht 73.0 in | Wt 209.9 lb

## 2013-01-28 DIAGNOSIS — C6292 Malignant neoplasm of left testis, unspecified whether descended or undescended: Secondary | ICD-10-CM

## 2013-01-28 MED ORDER — LORAZEPAM 0.5 MG PO TABS: 0.5000 mg | ORAL_TABLET | Freq: Three times a day (TID) | ORAL | Status: DC

## 2013-01-28 NOTE — Progress Notes (Signed)
Mr. Meiklejohn here for weekly under treat visit.  He has had 13 fractions to his abdomin/left pelvis.  He denies pain.  He did have an upset stomach yesterday that went away with rest.  He does have fatigue especially in the mornings.  He denies diarrhea and any bladder changes.  He does state that he is worried about being depressed.  He says he gets angry at little things and is wondering if this is how he is expressing depression over his diagnosis.

## 2013-01-28 NOTE — Progress Notes (Signed)
[facility]    Radiation Oncology [address] [place]     Maryln Gottron, M.D. [place], [place] [address]               Billie Lade, M.D., Ph.D. Phone: [phone]      Molli Hazard A. Kathrynn Running, M.D. Fax: [phone]      Radene Gunning, M.D., Ph.D.         Lurline Hare, M.D.         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  Current Dose: 16.25 Gy  Current Fraction: 13  Planned Dose: 35 Gy  Narrative: Clifford Davis was seen today for weekly treatment management. The chart was checked and CBCT  were reviewed. Lastly the patient did have a lot of fatigue with his busy work schedule outside in daily radiation treatments. Per discussion with the patient, his wife feels he is quite irritable. Patient is having some difficulties with sleeping at night and does feel somewhat anxious with the overall situation and work schedule. Patient has been given a limited prescription of Ativan in light of these issues. He denies any nausea or bowel problems.  Bee venom  Current Outpatient Prescriptions  Medication Sig Dispense Refill  . cetirizine (ZYRTEC) 10 MG tablet Take 10 mg by mouth daily.      Marland Kitchen omeprazole (PRILOSEC) 40 MG capsule Take 40 mg by mouth daily.      . prochlorperazine (COMPAZINE) 10 MG tablet Take 1 tablet (10 mg total) by mouth every 6 (six) hours as needed.  30 tablet  0  . ranitidine (ZANTAC) 150 MG tablet Take 150 mg by mouth every morning.      Marland Kitchen LORazepam (ATIVAN) 0.5 MG tablet Take 1 tablet (0.5 mg total) by mouth every 8 (eight) hours.  30 tablet  0   No current facility-administered medications for this encounter.   Labs:  Lab Results  Component Value Date   WBC 6.6 12/18/2012   HGB 16.0 12/18/2012   HCT 46.2 12/18/2012   MCV 88.2 12/18/2012   PLT 366 12/18/2012   Lab Results  Component Value Date   CREATININE 1.0 12/18/2012   BUN 12.5 12/18/2012   NA 142 12/18/2012   K 3.7 12/18/2012   CL 105 12/18/2012   CO2 27 12/18/2012   Lab Results  Component Value Date   ALT 32 12/18/2012   AST 14 12/18/2012   BILITOT 0.31 12/18/2012    Physical Examination:  height is 6\' 1"  (1.854 m) and weight is 209 lb 14.4 oz (95.21 kg). His temperature is 98.1 F (36.7 C). His blood pressure is 121/78 and his pulse is 78.    Wt Readings from Last 3 Encounters:  01/28/13 209 lb 14.4 oz (95.21 kg)  01/21/13 212 lb 11.2 oz (96.48 kg)  01/14/13 210 lb 9.6 oz (95.528 kg)     Lungs - Normal respiratory effort, chest expands symmetrically. Lungs are clear to auscultation, no crackles or wheezes.  Heart has regular rhythm and rate  Abdomen is soft and non tender with normal bowel sounds  Assessment:  Patient tolerating treatments well except for issues as  above  Plan: Continue treatment per original radiation prescription

## 2013-01-29 ENCOUNTER — Ambulatory Visit
Admission: RE | Admit: 2013-01-29 | Discharge: 2013-01-29 | Disposition: A | Discharge status: Home / Self Care | Payer: 59 | Source: Ambulatory Visit | Attending: Radiation Oncology | Admitting: Radiation Oncology

## 2013-01-30 ENCOUNTER — Ambulatory Visit
Admission: RE | Admit: 2013-01-30 | Discharge: 2013-01-30 | Disposition: A | Discharge status: Home / Self Care | Payer: 59 | Source: Ambulatory Visit | Attending: Radiation Oncology | Admitting: Radiation Oncology

## 2013-01-31 ENCOUNTER — Ambulatory Visit
Admission: RE | Admit: 2013-01-31 | Discharge: 2013-01-31 | Disposition: A | Discharge status: Home / Self Care | Payer: 59 | Source: Ambulatory Visit | Attending: Radiation Oncology | Admitting: Radiation Oncology

## 2013-02-03 ENCOUNTER — Ambulatory Visit
Admission: RE | Admit: 2013-02-03 | Discharge: 2013-02-03 | Disposition: A | Discharge status: Home / Self Care | Payer: 59 | Source: Ambulatory Visit | Attending: Radiation Oncology | Admitting: Radiation Oncology

## 2013-02-04 ENCOUNTER — Ambulatory Visit
Admission: RE | Admit: 2013-02-04 | Discharge: 2013-02-04 | Disposition: A | Discharge status: Home / Self Care | Payer: 59 | Source: Ambulatory Visit | Attending: Radiation Oncology | Admitting: Radiation Oncology

## 2013-02-04 VITALS — BP 126/82 | HR 74 | Temp 98.4°F | Wt 207.0 lb

## 2013-02-04 DIAGNOSIS — C6292 Malignant neoplasm of left testis, unspecified whether descended or undescended: Secondary | ICD-10-CM

## 2013-02-04 NOTE — Progress Notes (Signed)
Patient here for routine weekly assessment of radiation to abdomen for testicular cancer.Denies pain or nausea today but did feel a little sick on yesterday.Took today off from work and feels much better.No skin changes per patient.Minimal fatigue.

## 2013-02-04 NOTE — Progress Notes (Signed)
[facility]    Radiation Oncology [address] [place]     Maryln Gottron, M.D. [place], [place] [address]               Billie Lade, M.D., Ph.D. Phone: [phone]      Molli Hazard A. Kathrynn Running, M.D. Fax: [phone]      Radene Gunning, M.D., Ph.D.         Lurline Hare, M.D.         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  Current Dose: 22.5 Gy  Current Fraction: 18  Planned Dose: 35 Gy  Narrative: Clifford Davis was seen today for weekly treatment management. The chart was checked and CBCT  were reviewed. He has had some very intermittent nausea. He did have some mild diarrhea earlier in the week. He has had some fatigue and did not present to work today in light of this issue. Overall he feels the Ativan is helping with his irritability.  Bee venom Current Outpatient Prescriptions  Medication Sig Dispense Refill  . cetirizine (ZYRTEC) 10 MG tablet Take 10 mg by mouth daily.      Marland Kitchen LORazepam (ATIVAN) 0.5 MG tablet Take 1 tablet (0.5 mg total) by mouth every 8 (eight) hours.  30 tablet  0  . omeprazole (PRILOSEC) 40 MG capsule Take 40 mg by mouth daily.      . prochlorperazine (COMPAZINE) 10 MG tablet Take 1 tablet (10 mg total) by mouth every 6 (six) hours as needed.  30 tablet  0  . ranitidine (ZANTAC) 150 MG tablet Take 150 mg by mouth every morning.       No current facility-administered medications for this encounter.   Labs:  Lab Results  Component Value Date   WBC 6.6 12/18/2012   HGB 16.0 12/18/2012   HCT 46.2 12/18/2012   MCV 88.2 12/18/2012   PLT 366 12/18/2012   Lab Results  Component Value Date   CREATININE 1.0 12/18/2012   BUN 12.5 12/18/2012   NA 142 12/18/2012   K 3.7 12/18/2012   CL 105  12/18/2012   CO2 27 12/18/2012   Lab Results  Component Value Date   ALT 32 12/18/2012   AST 14 12/18/2012   BILITOT 0.31 12/18/2012    Physical Examination:  weight is 207 lb (93.895 kg). His temperature is 98.4 F (36.9 C). His blood pressure is 126/82 and his pulse is 74. His oxygen saturation is 99%.    Wt Readings from Last 3 Encounters:  02/04/13 207 lb (93.895 kg)  01/28/13 209 lb 14.4 oz (95.21 kg)  01/21/13 212 lb 11.2 oz (96.48 kg)     Lungs - Normal respiratory effort, chest expands symmetrically. Lungs are clear to auscultation, no crackles or wheezes.  Heart has regular rhythm and rate  Abdomen is soft and non tender with normal bowel sounds  Assessment:  Patient tolerating treatments well except for issues as above  Plan: Continue treatment per original radiation prescription

## 2013-02-05 ENCOUNTER — Ambulatory Visit
Admission: RE | Admit: 2013-02-05 | Discharge: 2013-02-05 | Disposition: A | Discharge status: Home / Self Care | Payer: 59 | Source: Ambulatory Visit | Attending: Radiation Oncology | Admitting: Radiation Oncology

## 2013-02-06 ENCOUNTER — Ambulatory Visit
Admission: RE | Admit: 2013-02-06 | Discharge: 2013-02-06 | Disposition: A | Discharge status: Home / Self Care | Payer: 59 | Source: Ambulatory Visit | Attending: Radiation Oncology | Admitting: Radiation Oncology

## 2013-02-07 ENCOUNTER — Ambulatory Visit
Admission: RE | Admit: 2013-02-07 | Discharge: 2013-02-07 | Disposition: A | Discharge status: Home / Self Care | Payer: 59 | Source: Ambulatory Visit | Attending: Radiation Oncology | Admitting: Radiation Oncology

## 2013-02-10 ENCOUNTER — Ambulatory Visit
Admission: RE | Admit: 2013-02-10 | Discharge: 2013-02-10 | Disposition: A | Discharge status: Home / Self Care | Payer: 59 | Source: Ambulatory Visit | Attending: Radiation Oncology | Admitting: Radiation Oncology

## 2013-02-11 ENCOUNTER — Ambulatory Visit
Admission: RE | Admit: 2013-02-11 | Discharge: 2013-02-11 | Disposition: A | Discharge status: Home / Self Care | Payer: 59 | Source: Ambulatory Visit | Attending: Radiation Oncology | Admitting: Radiation Oncology

## 2013-02-11 VITALS — BP 120/71 | HR 69 | Temp 99.0°F | Ht 73.0 in | Wt 208.0 lb

## 2013-02-11 DIAGNOSIS — C6292 Malignant neoplasm of left testis, unspecified whether descended or undescended: Secondary | ICD-10-CM

## 2013-02-11 MED ORDER — LORAZEPAM 1 MG PO TABS: 1.0000 mg | ORAL_TABLET | Freq: Three times a day (TID) | ORAL | Status: DC | PRN

## 2013-02-11 NOTE — Progress Notes (Signed)
Clifford Davis here for weekly under treat visit.  He has had 23 fractions to his abdomen/pelvis.  He denies pain, bowel changes and diarrhea.  He does have fatigue.  He states that his mood has improved with the Ativan and does need a refill.  His skin is intact on his abdomen.

## 2013-02-11 NOTE — Progress Notes (Signed)
[facility]    Radiation Oncology [address] [place]     Maryln Gottron, M.D. [place], [place] [address]               Billie Lade, M.D., Ph.D. Phone: [phone]      Molli Hazard A. Kathrynn Running, M.D. Fax: [phone]      Radene Gunning, M.D., Ph.D.         Lurline Hare, M.D.         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  Current Dose: 31.0 Gy  Current Fraction: 23  Planned Dose: 35 Gy  Narrative: [name] was seen today for weekly treatment management. The chart was checked and CBCT  were reviewed. He is having a fair amount of fatigue at this time.  He did have some nausea and mild diarrhea over the weekend but none at this time.  Bee venom Current Outpatient Prescriptions  Medication Sig Dispense Refill  . cetirizine (ZYRTEC) 10 MG tablet Take 10 mg by mouth daily.      Marland Kitchen LORazepam (ATIVAN) 0.5 MG tablet Take 1 tablet (0.5 mg total) by mouth every 8 (eight) hours.  30 tablet  0  . omeprazole (PRILOSEC) 40 MG capsule Take 40 mg by mouth daily.      . prochlorperazine (COMPAZINE) 10 MG tablet Take 1 tablet (10 mg total) by mouth every 6 (six) hours as needed.  30 tablet  0  . ranitidine (ZANTAC) 150 MG tablet Take 150 mg by mouth every morning.      Marland Kitchen LORazepam (ATIVAN) 1 MG tablet Take 1 tablet (1 mg total) by mouth every 8 (eight) hours as needed for anxiety.  30 tablet  0   No current facility-administered medications for this encounter.       Physical Examination:  height is 6\' 1"  (1.854 m) and weight is 208 lb (94.348 kg). His temperature is 99 F (37.2 C). His blood pressure is 120/71 and his pulse is 69.    Wt Readings from Last 3 Encounters:  02/11/13 208 lb (94.348 kg)  02/04/13 207 lb  (93.895 kg)  01/28/13 209 lb 14.4 oz (95.21 kg)     Lungs - Normal respiratory effort, chest expands symmetrically. Lungs are clear to auscultation, no crackles or wheezes.  Heart has regular rhythm and rate  Abdomen is soft and non tender with normal bowel sounds  Assessment:  Patient tolerating treatments well  Plan: Continue treatment per original radiation prescription

## 2013-02-12 ENCOUNTER — Ambulatory Visit
Admission: RE | Admit: 2013-02-12 | Discharge: 2013-02-12 | Disposition: A | Discharge status: Home / Self Care | Payer: 59 | Source: Ambulatory Visit | Attending: Radiation Oncology | Admitting: Radiation Oncology

## 2013-02-13 ENCOUNTER — Ambulatory Visit
Admission: RE | Admit: 2013-02-13 | Discharge: 2013-02-13 | Disposition: A | Discharge status: Home / Self Care | Payer: 59 | Source: Ambulatory Visit | Attending: Radiation Oncology | Admitting: Radiation Oncology

## 2013-02-18 ENCOUNTER — Telehealth: Payer: Self-pay | Admitting: *Deleted

## 2013-02-18 NOTE — Telephone Encounter (Signed)
Received financial information for assistance.  Gave to [employer]

## 2013-02-19 ENCOUNTER — Ambulatory Visit (HOSPITAL_BASED_OUTPATIENT_CLINIC_OR_DEPARTMENT_OTHER): Payer: 59 | Admitting: Oncology

## 2013-02-19 ENCOUNTER — Telehealth: Payer: Self-pay | Admitting: Oncology

## 2013-02-19 ENCOUNTER — Other Ambulatory Visit (HOSPITAL_BASED_OUTPATIENT_CLINIC_OR_DEPARTMENT_OTHER): Payer: 59 | Admitting: Lab

## 2013-02-19 VITALS — BP 120/80 | HR 92 | Temp 97.5°F | Resp 20 | Ht 73.0 in | Wt 206.5 lb

## 2013-02-19 DIAGNOSIS — C6290 Malignant neoplasm of unspecified testis, unspecified whether descended or undescended: Secondary | ICD-10-CM

## 2013-02-19 LAB — CBC WITH DIFFERENTIAL/PLATELET
BASO%: 0.6 % (ref 0.0–2.0)
LYMPH%: 11.5 % — ABNORMAL LOW (ref 14.0–49.0)
MCHC: 35.5 g/dL (ref 32.0–36.0)
MONO#: 0.4 10*3/uL (ref 0.1–0.9)
MONO%: 12.9 % (ref 0.0–14.0)
Platelets: 210 10*3/uL (ref 140–400)
RBC: 4.84 10*6/uL (ref 4.20–5.82)
RDW: 13.4 % (ref 11.0–14.6)
WBC: 3.4 10*3/uL — ABNORMAL LOW (ref 4.0–10.3)

## 2013-02-19 LAB — COMPREHENSIVE METABOLIC PANEL (CC13)
ALT: 61 U/L — ABNORMAL HIGH (ref 0–55)
Alkaline Phosphatase: 68 U/L (ref 40–150)
CO2: 27 mEq/L (ref 22–29)
Sodium: 140 mEq/L (ref 136–145)
Total Bilirubin: 0.73 mg/dL (ref 0.20–1.20)
Total Protein: 7 g/dL (ref 6.4–8.3)

## 2013-02-19 NOTE — Progress Notes (Signed)
Hematology and Oncology Follow Up Visit  ASSER LUCENA [id number] 12-07-1975 37 y.o. 02/19/2013 3:23 PM   Principle Diagnosis: 37 year old with left testicular cancer with histology showed pure seminoma diagnosed in April of 2014. He presented with a left testicular mass and a CT scan showed 3.1 x 2.2 retroperitoneal lymph node indicating stage IIB disease.   Prior Therapy: He is status post radiation therapy for a total of 35 gray to the para-aortic and pelvic lymph node completed around 02/13/2013.  Current therapy: Observation and surveillance.  Interim History:  Mr. Herms is a pleasant 37 year old gentleman I saw him initially in consultation back in April of 2014 but evaluation for stage IIb pure seminoma. At that time given the option of systemic chemotherapy versus definitive radiation therapy and he elected to proceed with radiation which he has completed last week. If tolerated very well without any major toxicity. Had not reported any abdominal pain discomfort he did report some mild fatigue but otherwise have regained most activities of daily living without any hindrance or decline. Has not reported any abdominal masses. Has not reported any neck adenopathy. For the most part she performs most activities of daily living without any hindrance or decline.  Medications: I have reviewed the patient's current medications.  Current Outpatient Prescriptions  Medication Sig Dispense Refill  . cetirizine (ZYRTEC) 10 MG tablet Take 10 mg by mouth daily.      Marland Kitchen ibuprofen (ADVIL,MOTRIN) 200 MG tablet Take 200 mg by mouth every 6 (six) hours as needed for pain.      Marland Kitchen LORazepam (ATIVAN) 0.5 MG tablet Take 1 tablet (0.5 mg total) by mouth every 8 (eight) hours.  30 tablet  0  . LORazepam (ATIVAN) 1 MG tablet Take 1 tablet (1 mg total) by mouth every 8 (eight) hours as needed for anxiety.  30 tablet  0  . omeprazole (PRILOSEC) 40 MG capsule Take 40 mg by mouth daily.      . prochlorperazine  (COMPAZINE) 10 MG tablet Take 1 tablet (10 mg total) by mouth every 6 (six) hours as needed.  30 tablet  0  . ranitidine (ZANTAC) 150 MG tablet Take 150 mg by mouth every morning.       No current facility-administered medications for this visit.     Allergies:  Allergies  Allergen Reactions  . Bee Venom Anaphylaxis    Past Medical History, Surgical history, Social history, and Family History were reviewed and updated.  Review of Systems: Constitutional:  Negative for fever, chills, night sweats, anorexia, weight loss, pain. Cardiovascular: no chest pain or dyspnea on exertion Respiratory: negative Neurological: negative Dermatological: negative ENT: negative Skin: Negative. Gastrointestinal: negative Genito-Urinary: negative Hematological and Lymphatic: negative Breast: negative Musculoskeletal: negative Remaining ROS negative. Physical Exam: Blood pressure 120/80, pulse 92, temperature 97.5 F (36.4 C), temperature source Oral, resp. rate 20, height 6\' 1"  (1.854 m), weight 206 lb 8 oz (93.668 kg), SpO2 99.00%. ECOG:  General appearance: alert Head: Normocephalic, without obvious abnormality, atraumatic Neck: no adenopathy, no carotid bruit, no JVD, supple, symmetrical, trachea midline and thyroid not enlarged, symmetric, no tenderness/mass/nodules Lymph nodes: Cervical, supraclavicular, and axillary nodes normal. Heart:regular rate and rhythm, S1, S2 normal, no murmur, click, rub or gallop Lung:chest clear, no wheezing, rales, normal symmetric air entry Abdomin: soft, non-tender, without masses or organomegaly EXT:no erythema, induration, or nodules   Lab Results: Lab Results  Component Value Date   WBC 3.4* 02/19/2013   HGB 14.8 02/19/2013   HCT 41.7 02/19/2013  MCV 86.2 02/19/2013   PLT 210 02/19/2013     Chemistry      Component Value Date/Time   NA 142 12/18/2012 1340   NA 140 10/03/2007 1430   K 3.7 12/18/2012 1340   K 3.2* 10/03/2007 1430   CL 105 12/18/2012  1340   CL 103 10/03/2007 1430   CO2 27 12/18/2012 1340   CO2 25 10/03/2007 1430   BUN 12.5 12/18/2012 1340   BUN 5* 10/03/2007 1430   CREATININE 1.0 12/18/2012 1340   CREATININE 0.58 10/03/2007 1430      Component Value Date/Time   CALCIUM 9.9 12/18/2012 1340   CALCIUM 9.6 10/03/2007 1430   ALKPHOS 81 12/18/2012 1340   ALKPHOS 95 10/03/2007 1430   AST 14 12/18/2012 1340   AST 16 10/03/2007 1430   ALT 32 12/18/2012 1340   ALT 17 10/03/2007 1430   BILITOT 0.31 12/18/2012 1340   BILITOT 0.9 10/03/2007 1430      Impression and Plan:  37 year old gentleman with the following issues:  1. 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 He is status post definitive treatment with radiation therapy completed in June of 2014. His tumor markers have completely normalized prior to his radiation therapy. Clinically he is doing well without any evidence to suggest recurrent disease and the plan is to continue active surveillance at this time. I will have him come back in about 1 month for repeat imaging studies which probably will be repeated every 6 months with clinical visits every 3 months for the first 2 years. He understands he might need systemic chemotherapy should his cancer relapses.  2. Followup: Will be in one month after a CT scan.   [facility], MD 6/25/20143:23 PM

## 2013-02-19 NOTE — Telephone Encounter (Signed)
gv and printed appt sched for pt...gv pt barium

## 2013-02-24 ENCOUNTER — Encounter: Payer: Self-pay | Admitting: Radiation Oncology

## 2013-02-24 NOTE — Progress Notes (Signed)
  Radiation Oncology         [phone] ________________________________  Name: Clifford Davis MRN: [id number]  Date: 02/24/2013  DOB: May 27, 1976  End of Treatment Note  Diagnosis:      Stage II-B (T2, N2, M0) testicular seminoma   Indication for treatment:  Postop,  definitive treatment       Radiation treatment dates:   May 15 through June 19  Site/dose:   Left pelvis and periaortic area, 25 gray in 20 fractions, the site of adenopathy in the periaortic area was boosted to a dose of 35 gray  Beams/energy:   Initial treatment was with an AP and PA fields encompassing the periaortic and left pelvis nodal areas. The nodal areas in the periaortic region were boosted with a 3-D dynamic conformal arc  Narrative: The patient tolerated radiation treatment relatively well.   He experienced mild nausea,  diarrhea and fatigue during the course of his treatment.  Plan: The patient has completed radiation treatment. The patient will return to radiation oncology clinic for routine followup in one month. I advised them to call or return sooner if they have any questions or concerns related to their recovery or treatment.  -----------------------------------  Billie Lade, PhD, MD

## 2013-03-11 ENCOUNTER — Telehealth: Payer: Self-pay | Admitting: *Deleted

## 2013-03-11 ENCOUNTER — Other Ambulatory Visit: Payer: Self-pay | Admitting: Radiation Oncology

## 2013-03-11 MED ORDER — LORAZEPAM 1 MG PO TABS: 1.0000 mg | ORAL_TABLET | Freq: Three times a day (TID) | ORAL | Status: DC | PRN

## 2013-03-11 NOTE — Telephone Encounter (Signed)
Returned call from pt re: refill on Ativan. Per discussion w/Dr Aundria Rud will be refilled by phone to [facility]. Pt verbalized understanding. Refill as per Dr Roselind Messier called in to [facility].

## 2013-03-18 ENCOUNTER — Encounter: Payer: Self-pay | Admitting: Radiation Oncology

## 2013-03-18 DIAGNOSIS — Z923 Personal history of irradiation: Secondary | ICD-10-CM | POA: Insufficient documentation

## 2013-03-19 ENCOUNTER — Encounter (HOSPITAL_COMMUNITY): Payer: Self-pay

## 2013-03-19 ENCOUNTER — Ambulatory Visit (HOSPITAL_COMMUNITY)
Admission: RE | Admit: 2013-03-19 | Discharge: 2013-03-19 | Disposition: A | Discharge status: Home / Self Care | Payer: Medicaid Other | Source: Ambulatory Visit | Attending: Oncology | Admitting: Oncology

## 2013-03-19 DIAGNOSIS — Z923 Personal history of irradiation: Secondary | ICD-10-CM | POA: Insufficient documentation

## 2013-03-19 DIAGNOSIS — C6290 Malignant neoplasm of unspecified testis, unspecified whether descended or undescended: Secondary | ICD-10-CM | POA: Insufficient documentation

## 2013-03-19 DIAGNOSIS — Z9079 Acquired absence of other genital organ(s): Secondary | ICD-10-CM | POA: Insufficient documentation

## 2013-03-19 MED ORDER — IOHEXOL 300 MG/ML  SOLN
125.0000 mL | Freq: Once | INTRAMUSCULAR | Status: AC | PRN
  Administered 2013-03-19: 125 mL via INTRAVENOUS

## 2013-03-20 ENCOUNTER — Ambulatory Visit
Admission: RE | Admit: 2013-03-20 | Discharge: 2013-03-20 | Disposition: A | Discharge status: Home / Self Care | Payer: 59 | Source: Ambulatory Visit | Attending: Radiation Oncology | Admitting: Radiation Oncology

## 2013-03-20 ENCOUNTER — Encounter: Payer: Self-pay | Admitting: Radiation Oncology

## 2013-03-20 ENCOUNTER — Ambulatory Visit (HOSPITAL_BASED_OUTPATIENT_CLINIC_OR_DEPARTMENT_OTHER): Payer: Medicaid Other | Admitting: Oncology

## 2013-03-20 ENCOUNTER — Telehealth: Payer: Self-pay | Admitting: Oncology

## 2013-03-20 VITALS — BP 129/83 | HR 64 | Temp 97.1°F | Resp 20 | Wt 208.4 lb

## 2013-03-20 VITALS — BP 129/83 | HR 64 | Temp 97.1°F | Resp 18 | Ht 73.0 in | Wt 207.0 lb

## 2013-03-20 DIAGNOSIS — C6290 Malignant neoplasm of unspecified testis, unspecified whether descended or undescended: Secondary | ICD-10-CM

## 2013-03-20 DIAGNOSIS — C6292 Malignant neoplasm of left testis, unspecified whether descended or undescended: Secondary | ICD-10-CM

## 2013-03-20 NOTE — Progress Notes (Signed)
Radiation Oncology         [phone]) [phone] ________________________________  Name: Clifford Davis MRN: [id number]  Date: 03/20/2013  DOB: 1975/11/28  Follow-Up Visit Note  CC: No PCP Per Patient  Benjiman Core, MD  Diagnosis:   Stage IIB testicular seminoma  Interval Since Last Radiation:  4  weeks  Narrative:  The patient returns today for routine follow-up.  He is doing well at this time. He denies any residual fatigue. He denies any nausea abdominal pain or discomfort in the scrotal area.  He did undergo scans this week showing complete response to his therapy. Prior enlarged lymph nodes were subcentimeter on recent scan.                              ALLERGIES:  is allergic to bee venom.  Meds: Current Outpatient Prescriptions  Medication Sig Dispense Refill  . cetirizine (ZYRTEC) 10 MG tablet Take 10 mg by mouth daily.      Marland Kitchen ibuprofen (ADVIL,MOTRIN) 200 MG tablet Take 200 mg by mouth every 6 (six) hours as needed for pain.      Marland Kitchen LORazepam (ATIVAN) 1 MG tablet Take 1 tablet (1 mg total) by mouth every 8 (eight) hours as needed for anxiety.  100 tablet  0  . omeprazole (PRILOSEC) 40 MG capsule Take 40 mg by mouth daily.      . prochlorperazine (COMPAZINE) 10 MG tablet Take 1 tablet (10 mg total) by mouth every 6 (six) hours as needed.  30 tablet  0  . ranitidine (ZANTAC) 150 MG tablet Take 150 mg by mouth every morning.       No current facility-administered medications for this encounter.    Physical Findings: The patient is in no acute distress. Patient is alert and oriented.  weight is 208 lb 6.4 oz (94.53 kg). His temperature is 97.1 F (36.2 C). His blood pressure is 129/83 and his pulse is 64. His respiration is 20. Marland Kitchen  No palpable supraclavicular axillary or inguinal adenopathy. The lungs are clear. The heart has regular rhythm and rate. The abdomen is soft and nontender with normal bowel sounds. Patient's inguinal scar is well-healed. No testicular masses palpated  Lab  Findings: Lab Results  Component Value Date   WBC 3.4* 02/19/2013   HGB 14.8 02/19/2013   HCT 41.7 02/19/2013   MCV 86.2 02/19/2013   PLT 210 02/19/2013    @LASTCHEM @  Radiographic Findings: Ct Soft Tissue Neck W Contrast  03/19/2013   *RADIOLOGY REPORT*  Clinical Data:  Testicular cancer status post left orchiectomy, XRT to abdomen completed 01/2013, evaluate for lymphadenopathy  CT NECK, CHEST, ABDOMEN AND PELVIS WITH CONTRAST  Technique:  Multidetector CT imaging of the neck, chest, abdomen and pelvis was performed using the standard protocol following the bolus administration of intravenous contrast.  Contrast: OMNIPAQUE IOHEXOL 300 MG/ML  SOLN  Comparison:  PET CT dated 12/31/2012.  CT NECK  Findings:  No suspicious cervical lymphadenopathy.  Visualized nasopharyngeal soft tissues are within normal limits.  Minimal opacification of the left maxillary sinus.  The visualized paranasal sinuses and mastoid air cells are otherwise clear.  Thyroid is within normal limits.  Cervical spine is notable for minimal degenerative changes at C5-6.  Visualized brain parenchyma is unremarkable.  IMPRESSION: No evidence of metastatic disease in the neck.  CT CHEST  Findings: Lungs are clear.  No suspicious pulmonary nodules. No pleural effusion or pneumothorax.  The heart is normal in size.  No pericardial effusion.  Visual thymic tissue in the anterior mediastinum (series two/image 24).  No suspicious mediastinal, hilar, or axillary lymphadenopathy.  Very mild degenerative changes of the thoracic spine.  IMPRESSION: No evidence of metastatic disease in the chest.  CT ABDOMEN AND PELVIS  Findings: Liver, spleen, pancreas, and adrenal glands are within normal limits.  Gallbladder is unremarkable.  No intrahepatic or extrahepatic ductal dilatation.  Kidneys are within normal limits.  No hydronephrosis.  No evidence of bowel obstruction.  Normal appendix.  No evidence of abdominal aortic aneurysm.  Small residual  left para-aortic nodes measuring up to 6 mm short axis (series 2/images 74 and 80), improved (previously 3.3 cm).  Prostate is unremarkable.  Bladder is within normal limits.  Postsurgical changes in the left inguinal region from prior left orchiectomy.  Very mild degenerative changes at L3-4.  IMPRESSION: Status post left orchiectomy.  Small residual left para-aortic nodes measuring up to 6 mm short axis, improved.  No evidence of new/progressive metastatic disease in the abdomen/pelvis.   Original Report Authenticated By: Charline Bills, M.D.   Ct Chest W Contrast  03/19/2013   *RADIOLOGY REPORT*  Clinical Data:  Testicular cancer status post left orchiectomy, XRT to abdomen completed 01/2013, evaluate for lymphadenopathy  CT NECK, CHEST, ABDOMEN AND PELVIS WITH CONTRAST  Technique:  Multidetector CT imaging of the neck, chest, abdomen and pelvis was performed using the standard protocol following the bolus administration of intravenous contrast.  Contrast: OMNIPAQUE IOHEXOL 300 MG/ML  SOLN  Comparison:  PET CT dated 12/31/2012.  CT NECK  Findings:  No suspicious cervical lymphadenopathy.  Visualized nasopharyngeal soft tissues are within normal limits.  Minimal opacification of the left maxillary sinus.  The visualized paranasal sinuses and mastoid air cells are otherwise clear.  Thyroid is within normal limits.  Cervical spine is notable for minimal degenerative changes at C5-6.  Visualized brain parenchyma is unremarkable.  IMPRESSION: No evidence of metastatic disease in the neck.  CT CHEST  Findings: Lungs are clear.  No suspicious pulmonary nodules. No pleural effusion or pneumothorax.  The heart is normal in size.  No pericardial effusion.  Visual thymic tissue in the anterior mediastinum (series two/image 24).  No suspicious mediastinal, hilar, or axillary lymphadenopathy.  Very mild degenerative changes of the thoracic spine.  IMPRESSION: No evidence of metastatic disease in the chest.  CT  ABDOMEN AND PELVIS  Findings: Liver, spleen, pancreas, and adrenal glands are within normal limits.  Gallbladder is unremarkable.  No intrahepatic or extrahepatic ductal dilatation.  Kidneys are within normal limits.  No hydronephrosis.  No evidence of bowel obstruction.  Normal appendix.  No evidence of abdominal aortic aneurysm.  Small residual left para-aortic nodes measuring up to 6 mm short axis (series 2/images 74 and 80), improved (previously 3.3 cm).  Prostate is unremarkable.  Bladder is within normal limits.  Postsurgical changes in the left inguinal region from prior left orchiectomy.  Very mild degenerative changes at L3-4.  IMPRESSION: Status post left orchiectomy.  Small residual left para-aortic nodes measuring up to 6 mm short axis, improved.  No evidence of new/progressive metastatic disease in the abdomen/pelvis.   Original Report Authenticated By: Charline Bills, M.D.   Ct Abdomen Pelvis W Contrast  03/19/2013   *RADIOLOGY REPORT*  Clinical Data:  Testicular cancer status post left orchiectomy, XRT to abdomen completed 01/2013, evaluate for lymphadenopathy  CT NECK, CHEST, ABDOMEN AND PELVIS WITH CONTRAST  Technique:  Multidetector  CT imaging of the neck, chest, abdomen and pelvis was performed using the standard protocol following the bolus administration of intravenous contrast.  Contrast: OMNIPAQUE IOHEXOL 300 MG/ML  SOLN  Comparison:  PET CT dated 12/31/2012.  CT NECK  Findings:  No suspicious cervical lymphadenopathy.  Visualized nasopharyngeal soft tissues are within normal limits.  Minimal opacification of the left maxillary sinus.  The visualized paranasal sinuses and mastoid air cells are otherwise clear.  Thyroid is within normal limits.  Cervical spine is notable for minimal degenerative changes at C5-6.  Visualized brain parenchyma is unremarkable.  IMPRESSION: No evidence of metastatic disease in the neck.  CT CHEST  Findings: Lungs are clear.  No suspicious pulmonary  nodules. No pleural effusion or pneumothorax.  The heart is normal in size.  No pericardial effusion.  Visual thymic tissue in the anterior mediastinum (series two/image 24).  No suspicious mediastinal, hilar, or axillary lymphadenopathy.  Very mild degenerative changes of the thoracic spine.  IMPRESSION: No evidence of metastatic disease in the chest.  CT ABDOMEN AND PELVIS  Findings: Liver, spleen, pancreas, and adrenal glands are within normal limits.  Gallbladder is unremarkable.  No intrahepatic or extrahepatic ductal dilatation.  Kidneys are within normal limits.  No hydronephrosis.  No evidence of bowel obstruction.  Normal appendix.  No evidence of abdominal aortic aneurysm.  Small residual left para-aortic nodes measuring up to 6 mm short axis (series 2/images 74 and 80), improved (previously 3.3 cm).  Prostate is unremarkable.  Bladder is within normal limits.  Postsurgical changes in the left inguinal region from prior left orchiectomy.  Very mild degenerative changes at L3-4.  IMPRESSION: Status post left orchiectomy.  Small residual left para-aortic nodes measuring up to 6 mm short axis, improved.  No evidence of new/progressive metastatic disease in the abdomen/pelvis.   Original Report Authenticated By: Charline Bills, M.D.    Impression:  The patient is recovering from the effects of radiation.  Good response on scans as above  Plan:  Routine followup in 6 months in conjunction with Dr. Alver Fisher appointment.  _____________________________________  -----------------------------------  Billie Lade, PhD, MD

## 2013-03-20 NOTE — Progress Notes (Signed)
Pt denies pain, fatigue, loss of appetite, urinary/bowel issues, nausea. He is currently out of work and looking for employment.

## 2013-03-20 NOTE — Telephone Encounter (Signed)
gv and printed avs for pt....gv pt barium

## 2013-03-20 NOTE — Progress Notes (Signed)
Hematology and Oncology Follow Up Visit  Clifford Davis [id number] November 30, 1975 37 y.o. 03/20/2013 9:28 AM   Principle Diagnosis: 37 year old with left testicular cancer with histology showed pure seminoma diagnosed in April of 2014. He presented with a left testicular mass and a CT scan showed 3.1 x 2.2 retroperitoneal lymph node indicating stage IIB disease.   Prior Therapy: He is status post radiation therapy for a total of 35 gray to the para-aortic and pelvic lymph node completed around 02/13/2013.  Current therapy: Observation and surveillance.  Interim History:  Clifford Davis is a pleasant 36 year old gentleman I saw him initially in consultation back in April of 2014 but evaluation for stage IIb pure seminoma. He completed radiation in 01/2013 and tolerated very well without any major toxicity. Had not reported any abdominal pain discomfort he did report some mild fatigue but otherwise have regained most activities of daily living without any hindrance or decline. Has not reported any abdominal masses. Has not reported any neck adenopathy. For the most part she performs most activities of daily living without any hindrance or decline.  Medications: I have reviewed the patient's current medications.  Current Outpatient Prescriptions  Medication Sig Dispense Refill  . cetirizine (ZYRTEC) 10 MG tablet Take 10 mg by mouth daily.      Marland Kitchen ibuprofen (ADVIL,MOTRIN) 200 MG tablet Take 200 mg by mouth every 6 (six) hours as needed for pain.      Marland Kitchen LORazepam (ATIVAN) 1 MG tablet Take 1 tablet (1 mg total) by mouth every 8 (eight) hours as needed for anxiety.  100 tablet  0  . omeprazole (PRILOSEC) 40 MG capsule Take 40 mg by mouth daily.      . prochlorperazine (COMPAZINE) 10 MG tablet Take 1 tablet (10 mg total) by mouth every 6 (six) hours as needed.  30 tablet  0  . ranitidine (ZANTAC) 150 MG tablet Take 150 mg by mouth every morning.       No current facility-administered medications for this visit.      Allergies:  Allergies  Allergen Reactions  . Bee Venom Anaphylaxis    Past Medical History, Surgical history, Social history, and Family History were reviewed and updated.  Review of Systems: Constitutional:  Negative for fever, chills, night sweats, anorexia, weight loss, pain. Cardiovascular: no chest pain or dyspnea on exertion Respiratory: negative Neurological: negative Dermatological: negative ENT: negative Skin: Negative. Gastrointestinal: negative Genito-Urinary: negative Hematological and Lymphatic: negative Breast: negative Musculoskeletal: negative Remaining ROS negative. Physical Exam: Blood pressure 129/83, pulse 64, temperature 97.1 F (36.2 C), temperature source Oral, resp. rate 18, height 6\' 1"  (1.854 m), weight 207 lb (93.895 kg). ECOG:  General appearance: alert Head: Normocephalic, without obvious abnormality, atraumatic Neck: no adenopathy, no carotid bruit, no JVD, supple, symmetrical, trachea midline and thyroid not enlarged, symmetric, no tenderness/mass/nodules Lymph nodes: Cervical, supraclavicular, and axillary nodes normal. Heart:regular rate and rhythm, S1, S2 normal, no murmur, click, rub or gallop Lung:chest clear, no wheezing, rales, normal symmetric air entry Abdomin: soft, non-tender, without masses or organomegaly EXT:no erythema, induration, or nodules   Lab Results: Lab Results  Component Value Date   WBC 3.4* 02/19/2013   HGB 14.8 02/19/2013   HCT 41.7 02/19/2013   MCV 86.2 02/19/2013   PLT 210 02/19/2013     Chemistry      Component Value Date/Time   NA 140 02/19/2013 1457   NA 140 10/03/2007 1430   K 3.6 02/19/2013 1457   K 3.2* 10/03/2007 1430  CL 105 02/19/2013 1457   CL 103 10/03/2007 1430   CO2 27 02/19/2013 1457   CO2 25 10/03/2007 1430   BUN 11.7 02/19/2013 1457   BUN 5* 10/03/2007 1430   CREATININE 1.0 02/19/2013 1457   CREATININE 0.58 10/03/2007 1430      Component Value Date/Time   CALCIUM 9.7 02/19/2013 1457   CALCIUM  9.6 10/03/2007 1430   ALKPHOS 68 02/19/2013 1457   ALKPHOS 95 10/03/2007 1430   AST 28 02/19/2013 1457   AST 16 10/03/2007 1430   ALT 61* 02/19/2013 1457   ALT 17 10/03/2007 1430   BILITOT 0.73 02/19/2013 1457   BILITOT 0.9 10/03/2007 1430     *RADIOLOGY REPORT*  Clinical Data: Testicular cancer status post left orchiectomy, XRT  to abdomen completed 01/2013, evaluate for lymphadenopathy  CT NECK, CHEST, ABDOMEN AND PELVIS WITH CONTRAST  Technique: Multidetector CT imaging of the neck, chest, abdomen  and pelvis was performed using the standard protocol following the  bolus administration of intravenous contrast.  Contrast: OMNIPAQUE IOHEXOL 300 MG/ML SOLN  Comparison: PET CT dated 12/31/2012.  CT NECK  Findings: No suspicious cervical lymphadenopathy.  Visualized nasopharyngeal soft tissues are within normal limits.  Minimal opacification of the left maxillary sinus. The visualized  paranasal sinuses and mastoid air cells are otherwise clear.  Thyroid is within normal limits.  Cervical spine is notable for minimal degenerative changes at C5-6.  Visualized brain parenchyma is unremarkable.  IMPRESSION:  No evidence of metastatic disease in the neck.  CT CHEST  Findings: Lungs are clear. No suspicious pulmonary nodules. No  pleural effusion or pneumothorax.  The heart is normal in size. No pericardial effusion.  Visual thymic tissue in the anterior mediastinum (series two/image  24).  No suspicious mediastinal, hilar, or axillary lymphadenopathy.  Very mild degenerative changes of the thoracic spine.  IMPRESSION:  No evidence of metastatic disease in the chest.  CT ABDOMEN AND PELVIS  Findings: Liver, spleen, pancreas, and adrenal glands are within  normal limits.  Gallbladder is unremarkable. No intrahepatic or extrahepatic  ductal dilatation.  Kidneys are within normal limits. No hydronephrosis.  No evidence of bowel obstruction. Normal appendix.  No evidence of abdominal  aortic aneurysm.  Small residual left para-aortic nodes measuring up to 6 mm short  axis (series 2/images 74 and 80), improved (previously 3.3 cm).  Prostate is unremarkable.  Bladder is within normal limits.  Postsurgical changes in the left inguinal region from prior left  orchiectomy.  Very mild degenerative changes at L3-4.  IMPRESSION:  Status post left orchiectomy.  Small residual left para-aortic nodes measuring up to 6 mm short  axis, improved.  No evidence of new/progressive metastatic disease in the  abdomen/pelvis.   Impression and Plan:  37 year old gentleman with the following issues:  1. 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 He is status post definitive treatment with radiation therapy completed in June of 2014. His tumor markers have completely normalized prior to his radiation therapy. CT scans from 03/19/2013 discussed today and showed no disease. Clinically he is doing well without any evidence to suggest recurrent disease and the plan is to continue active surveillance at this time.I will repeat imaging studies  every 6 months with clinical visits for the first 2 years. He understands he might need systemic chemotherapy should his cancer relapses.  2. Followup: Will be in 6 months after a CT scan.   [facility], MD 7/24/20149:28 AM

## 2013-05-27 IMAGING — PT NM PET TUM IMG INITIAL (PI) SKULL BASE T - THIGH
6 series · 25 of 25 positions shown · non-contrast
Comparison: Multiple exams, including 12/12/2012 and 11/29/2012

CLINICAL DATA: Initial treatment strategy for testicular
cancer(seminoma).

NUCLEAR MEDICINE PET SKULL BASE TO THIGH
Fasting Blood Glucose:  104
TECHNIQUE: 18.1 mCi F-18 FDG was injected intravenously. CT data
was obtained and used for attenuation correction and anatomic
localization only.  (This was not acquired as a diagnostic CT
examination.) Additional exam technical data entered on
technologist worksheet.

[Series 1: pet ac · axial · 3.3mm · 4.69mm/px · z∈[-1062,-48]mm · 5 of 311 slices shown]
[im 1/311]
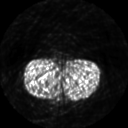
[im 78/311]
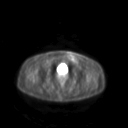
[im 156/311]
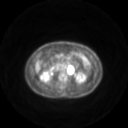
[im 233/311]
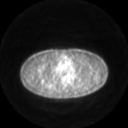
[im 311/311]
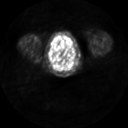

[Series 2: pet nac · axial · 3.3mm · 4.69mm/px · z∈[-1062,-48]mm · 6 of 311 slices shown]
[im 1/311]
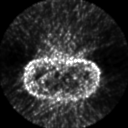
[im 63/311]
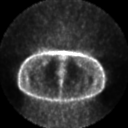
[im 125/311]
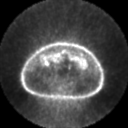
[im 187/311]
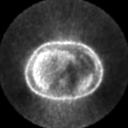
[im 249/311]
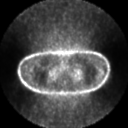
[im 311/311]
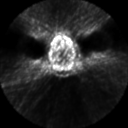

[Series 2: ct images · axial · 3.8mm · 0.98mm/px · z∈[-1062,-48]mm · 6 of 304 slices shown]
[im 1/304]
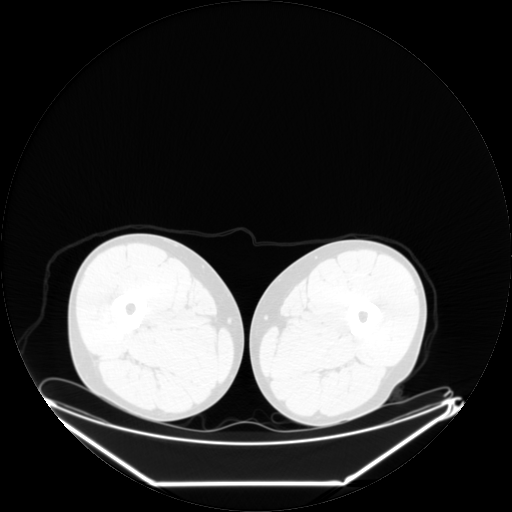
[im 61/304]
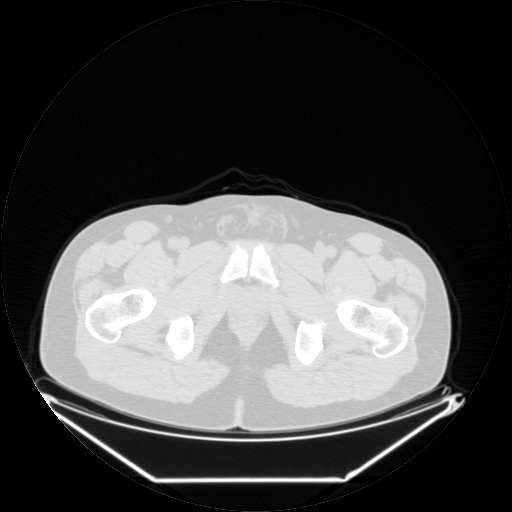
[im 122/304]
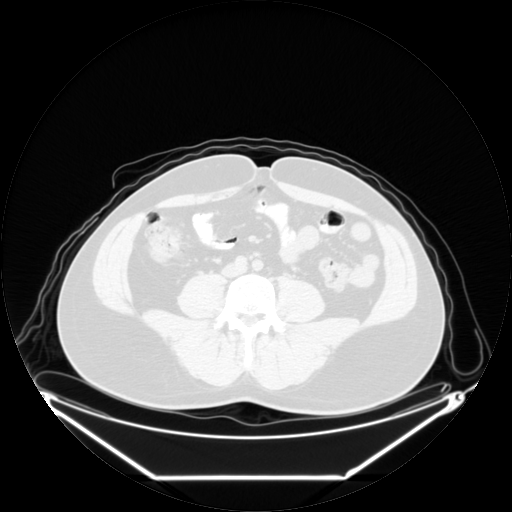
[im 182/304]
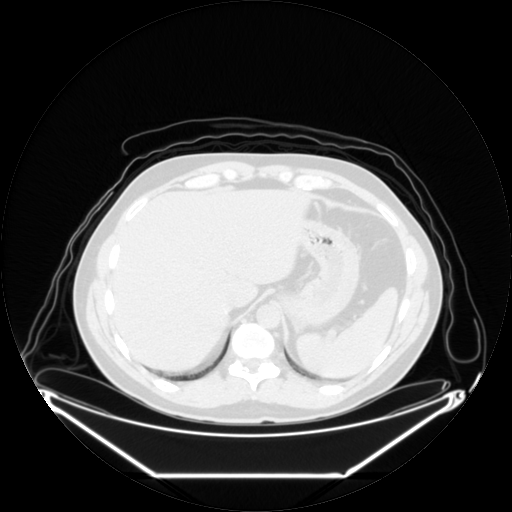
[im 243/304]
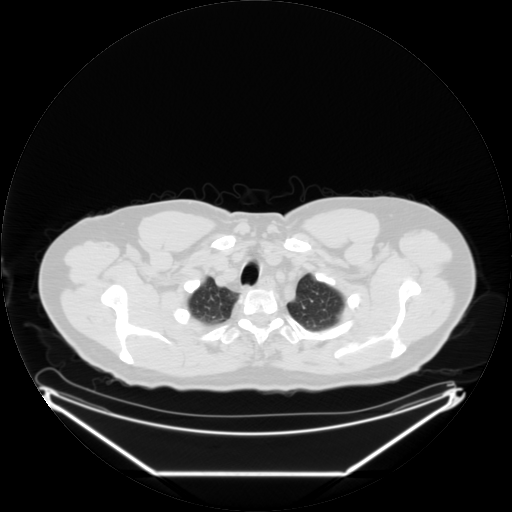
[im 304/304  brain]
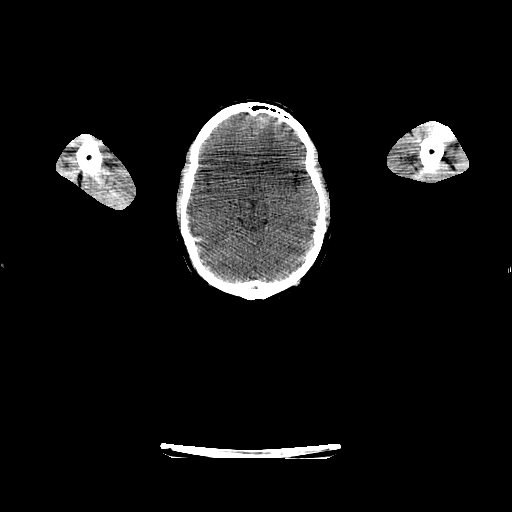

[Series 123: mip · coronal · 3.3mm · 4.69mm/px · 1 of 30 slices shown]
[im 1/30]
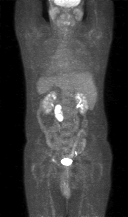

[Series 151: reformatted · axial · 3.3mm · 3.91mm/px · z∈[-1062,-48]mm · 6 of 308 slices shown (1 of 2)]
[im 1/308]
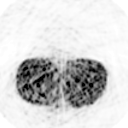
[im 62/308]
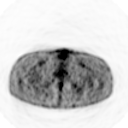
[im 123/308]
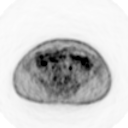
[im 185/308]
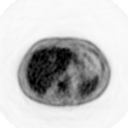
[im 246/308]
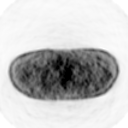
[im 308/308]
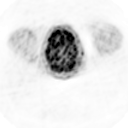

[Series 153: reformatted · coronal · 4.7mm · 8.14mm/px · 1 of 76 slices shown (2 of 2)]
[im 1/76]
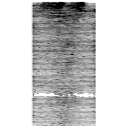

[25 of 25 positions shown; findings below may reference images not displayed]

FINDINGS: Neck: No significant hypermetabolic activity.  Incidental chronic
left maxillary sinusitis.

Chest:  No hypermetabolic mediastinal or hilar nodes.  No
suspicious pulmonary nodules on the CT scan.

Abdomen/Pelvis:  The cluster of left periaortic pathologic
adenopathy has a maximum standard uptake value of 21.6. Individual
nodes in this cluster measure up to 3.3 cm in short axis.  There is
hypermetabolic activity of the surgical site along the left
inguinal region, maximum standard uptake value 10.0, likely
postoperative rather than due to malignancy.  Left orchiectomy
noted with postoperative activity along the left scrotum.

Skeleton:  No focal hypermetabolic activity to suggest skeletal
metastasis.
IMPRESSION: 1.  The pathologically enlarged left periaortic lymph nodes are
markedly hypermetabolic, with maximum standard uptake value of
21.6.
2.  Hypermetabolic activity along the left inguinal region is
likely postoperative.
3.  No additional regions of significant abnormal hypermetabolic
activity.
4.  Incidental chronic left maxillary sinusitis.

## 2013-07-18 ENCOUNTER — Telehealth: Payer: Self-pay | Admitting: Medical Oncology

## 2013-07-18 NOTE — Telephone Encounter (Signed)
Patient called stating he received letter from his insurance company, [employer],  stating that his CT of chest/abd/pelvis is not approved d/t "he has no history of cancer in those areas." Scan scheduled for Jan 20,2015. Review with managed care, informed patient to call his insurance company to ask why sending letter since scan precert has not been sent yet d/t it not being 30 days prior to sched of scan. Patient verbalized understanding and stated he would call his insurance and call office back with any other questions or concerns.

## 2013-08-14 ENCOUNTER — Telehealth: Payer: Self-pay | Admitting: Oncology

## 2013-08-14 NOTE — Telephone Encounter (Signed)
lvm for pt regarding to 1.22 appt moved to 2.5 per MD request on Pal....mailed pt avs..appt sched and letter

## 2013-09-12 ENCOUNTER — Encounter: Payer: Self-pay | Admitting: Oncology

## 2013-09-12 NOTE — Progress Notes (Signed)
[employer] approved ncap @ [facility] 10/16/13 auth # [id number]

## 2013-09-16 ENCOUNTER — Encounter (HOSPITAL_COMMUNITY): Payer: Self-pay

## 2013-09-16 ENCOUNTER — Other Ambulatory Visit (HOSPITAL_BASED_OUTPATIENT_CLINIC_OR_DEPARTMENT_OTHER): Payer: Medicaid Other

## 2013-09-16 ENCOUNTER — Ambulatory Visit (HOSPITAL_COMMUNITY)
Admission: RE | Admit: 2013-09-16 | Discharge: 2013-09-16 | Disposition: A | Discharge status: Home / Self Care | Payer: Medicaid Other | Source: Ambulatory Visit | Attending: Oncology | Admitting: Oncology

## 2013-09-16 DIAGNOSIS — M5144 Schmorl's nodes, thoracic region: Secondary | ICD-10-CM | POA: Insufficient documentation

## 2013-09-16 DIAGNOSIS — C6290 Malignant neoplasm of unspecified testis, unspecified whether descended or undescended: Secondary | ICD-10-CM

## 2013-09-16 DIAGNOSIS — M47812 Spondylosis without myelopathy or radiculopathy, cervical region: Secondary | ICD-10-CM | POA: Insufficient documentation

## 2013-09-16 DIAGNOSIS — M47817 Spondylosis without myelopathy or radiculopathy, lumbosacral region: Secondary | ICD-10-CM | POA: Insufficient documentation

## 2013-09-16 DIAGNOSIS — Z9079 Acquired absence of other genital organ(s): Secondary | ICD-10-CM | POA: Insufficient documentation

## 2013-09-16 DIAGNOSIS — Z923 Personal history of irradiation: Secondary | ICD-10-CM | POA: Insufficient documentation

## 2013-09-16 LAB — CBC WITH DIFFERENTIAL/PLATELET
BASO%: 1.1 % (ref 0.0–2.0)
Basophils Absolute: 0 10*3/uL (ref 0.0–0.1)
EOS%: 4.4 % (ref 0.0–7.0)
Eosinophils Absolute: 0.2 10*3/uL (ref 0.0–0.5)
HEMATOCRIT: 44.3 % (ref 38.4–49.9)
HGB: 15.2 g/dL (ref 13.0–17.1)
LYMPH%: 32.2 % (ref 14.0–49.0)
MCH: 31 pg (ref 27.2–33.4)
MCHC: 34.2 g/dL (ref 32.0–36.0)
MCV: 90.5 fL (ref 79.3–98.0)
MONO#: 0.3 10*3/uL (ref 0.1–0.9)
MONO%: 6.4 % (ref 0.0–14.0)
NEUT#: 2.2 10*3/uL (ref 1.5–6.5)
NEUT%: 55.9 % (ref 39.0–75.0)
PLATELETS: 215 10*3/uL (ref 140–400)
RBC: 4.89 10*6/uL (ref 4.20–5.82)
RDW: 13.6 % (ref 11.0–14.6)
WBC: 4 10*3/uL (ref 4.0–10.3)
lymph#: 1.3 10*3/uL (ref 0.9–3.3)

## 2013-09-16 LAB — COMPREHENSIVE METABOLIC PANEL (CC13)
ALK PHOS: 75 U/L (ref 40–150)
ALT: 39 U/L (ref 0–55)
ANION GAP: 9 meq/L (ref 3–11)
AST: 16 U/L (ref 5–34)
Albumin: 4.2 g/dL (ref 3.5–5.0)
BILIRUBIN TOTAL: 0.73 mg/dL (ref 0.20–1.20)
BUN: 12.3 mg/dL (ref 7.0–26.0)
CO2: 26 meq/L (ref 22–29)
CREATININE: 0.9 mg/dL (ref 0.7–1.3)
Calcium: 9.7 mg/dL (ref 8.4–10.4)
Chloride: 105 mEq/L (ref 98–109)
Glucose: 106 mg/dl (ref 70–140)
Potassium: 3.9 mEq/L (ref 3.5–5.1)
SODIUM: 141 meq/L (ref 136–145)
TOTAL PROTEIN: 7 g/dL (ref 6.4–8.3)

## 2013-09-16 LAB — LACTATE DEHYDROGENASE (CC13): LDH: 125 U/L (ref 125–245)

## 2013-09-16 MED ORDER — IOHEXOL 300 MG/ML  SOLN
100.0000 mL | Freq: Once | INTRAMUSCULAR | Status: AC | PRN
  Administered 2013-09-16: 100 mL via INTRAVENOUS

## 2013-09-17 LAB — AFP TUMOR MARKER: AFP-Tumor Marker: 3.2 ng/mL (ref 0.0–8.0)

## 2013-09-17 LAB — BETA HCG QUANT (REF LAB)

## 2013-09-18 ENCOUNTER — Ambulatory Visit: Payer: Medicaid Other | Admitting: Oncology

## 2013-09-18 ENCOUNTER — Encounter: Payer: Self-pay | Admitting: Radiation Oncology

## 2013-09-18 ENCOUNTER — Ambulatory Visit
Admission: RE | Admit: 2013-09-18 | Discharge: 2013-09-18 | Disposition: A | Discharge status: Home / Self Care | Payer: Medicaid Other | Source: Ambulatory Visit | Attending: Radiation Oncology | Admitting: Radiation Oncology

## 2013-09-18 VITALS — BP 141/74 | HR 90 | Temp 98.5°F | Wt 216.7 lb

## 2013-09-18 DIAGNOSIS — C6290 Malignant neoplasm of unspecified testis, unspecified whether descended or undescended: Secondary | ICD-10-CM

## 2013-09-18 NOTE — Progress Notes (Addendum)
Patient for routine follow up completion of radiation to pelvis for testicular seminoma.Denies pain.to review results of ct scan of chest,  abdomen and pelvis.Overall feels great.States the only side effects from radiation was nausea and that has resolved.

## 2013-09-18 NOTE — Progress Notes (Signed)
Radiation Oncology         [phone] ________________________________  Name: Clifford Davis MRN: [id number]  Date: 09/18/2013  DOB: 01-12-1976  Follow-Up Visit Note  CC: No PCP Per Patient  [name], MD  Diagnosis:   Stage II-B (T2, N2, M0) testicular seminoma   Interval Since Last Radiation:  6  months  Narrative:  The patient returns today for routine follow-up.  He is doing well and without complaints. The patient denies any flank pain back pain or pelvic pain. He denies any problems with swelling or pain in the scrotum. Patient did undergo recent imaging showing no evidence of recurrence.                              ALLERGIES:  is allergic to bee venom.  Meds: Current Outpatient Prescriptions  Medication Sig Dispense Refill  . cetirizine (ZYRTEC) 10 MG tablet Take 10 mg by mouth daily.      Marland Kitchen ibuprofen (ADVIL,MOTRIN) 200 MG tablet Take 200 mg by mouth every 6 (six) hours as needed for pain.      Marland Kitchen omeprazole (PRILOSEC) 40 MG capsule Take 40 mg by mouth daily.       No current facility-administered medications for this encounter.    Physical Findings: The patient is in no acute distress. Patient is alert and oriented.  weight is 216 lb 11.2 oz (98.294 kg). His temperature is 98.5 F (36.9 C). His blood pressure is 141/74 and his pulse is 90. His oxygen saturation is 100%. . No palpable supraclavicular or axillary adenopathy. The lungs are clear to auscultation. The heart has a regular rhythm and rate. The abdomen is soft and nontender with normal bowel sounds. There is no inguinal adenopathy. Examination of the scrotum reveals the left testicle to be surgically absent. The right testicle is soft without masses.  Lab Findings: Lab Results  Component Value Date   WBC 4.0 09/16/2013   HGB 15.2 09/16/2013   HCT 44.3 09/16/2013   MCV 90.5 09/16/2013   PLT 215 09/16/2013      Radiographic Findings: Ct Soft Tissue Neck W Contrast  09/16/2013   CLINICAL DATA:   Testicular cancer (seminoma), diagnosed 12/2012, status post left orchiectomy and abdominal XRT.  EXAM: CT CHEST WITH CONTRAST; CT ABDOMEN AND PELVIS WITH CONTRAST; CT NECK WITH CONTRAST  TECHNIQUE: Multidetector CT imaging of the neck was performed with intravenous contrast.; Multidetector CT imaging of the abdomen and pelvis was performed following the standard protocol during bolus administration of intravenous contrast.; Multidetector CT imaging of the chest was performed following the standard protocol during bolus administration of intravenous contrast.  CONTRAST:  119mL OMNIPAQUE IOHEXOL 300 MG/ML  SOLN  COMPARISON:  03/19/2013  FINDINGS: CT NECK FINDINGS  Small bilateral cervical lymph nodes measuring up to 6 mm short axis on the right (series 5/image 73), within normal limits. No suspicious cervical lymphadenopathy.  Nasopharyngeal soft tissues are within normal limits. Airway remains patent.  Mild partial opacification of the bilateral maxillary sinuses. Visualized paranasal sinuses and mastoid air cells are otherwise clear.  Visualized thyroid is unremarkable.  Mild degenerative changes of the cervical spine at C5-6.  Visualized brain parenchyma is unremarkable.  CT CHEST FINDINGS  Lungs are clear. No suspicious pulmonary nodules. Mild subpleural reticulation/ dependent atelectasis in the right lower lobe. No pleural effusion or pneumothorax.  Heart is normal in size.  No pericardial effusion.  Mild residual thymic tissue  in the anterior mediastinum (series 2/ image 25).  No suspicious mediastinal, hilar, or axillary lymphadenopathy. Mild degenerative changes of the mid thoracic spine with a superior endplate Schmorl's node at T8 (sagittal image 94).  CT ABDOMEN AND PELVIS FINDINGS  Liver is notable for focal fat/altered perfusion along the falciform ligament (Series 2/image 59).  Spleen, pancreas, and adrenal glands are within normal limits.  Gallbladder is unremarkable. No intrahepatic or extrahepatic  ductal dilatation.  Kidneys are within normal limits.  No hydronephrosis.  No evidence of bowel obstruction.  Normal appendix.  No evidence of abdominal aortic aneurysm.  No abdominopelvic ascites.  Tiny left para-aortic nodes measuring 4-5 mm short axis (series 2/ images 75 and 81), previously 6 mm, within normal limits. Prostate is unremarkable.  Bladder is mildly thick-walled although underdistended.  Status post left orchidectomy.  Very mild degenerative changes at L3-4.  IMPRESSION: Status post left orchiectomy.  Residual left para-aortic nodes measuring 4-5 mm short axis, within normal limits, previously 6 mm.  Otherwise, no evidence of recurrent or metastatic disease in the neck, chest, or abdomen/pelvis.   Electronically Signed   By: Julian Hy M.D.   On: 09/16/2013 11:54   Ct Chest W Contrast  09/16/2013   CLINICAL DATA:  Testicular cancer (seminoma), diagnosed 12/2012, status post left orchiectomy and abdominal XRT.  EXAM: CT CHEST WITH CONTRAST; CT ABDOMEN AND PELVIS WITH CONTRAST; CT NECK WITH CONTRAST  TECHNIQUE: Multidetector CT imaging of the neck was performed with intravenous contrast.; Multidetector CT imaging of the abdomen and pelvis was performed following the standard protocol during bolus administration of intravenous contrast.; Multidetector CT imaging of the chest was performed following the standard protocol during bolus administration of intravenous contrast.  CONTRAST:  141mL OMNIPAQUE IOHEXOL 300 MG/ML  SOLN  COMPARISON:  03/19/2013  FINDINGS: CT NECK FINDINGS  Small bilateral cervical lymph nodes measuring up to 6 mm short axis on the right (series 5/image 73), within normal limits. No suspicious cervical lymphadenopathy.  Nasopharyngeal soft tissues are within normal limits. Airway remains patent.  Mild partial opacification of the bilateral maxillary sinuses. Visualized paranasal sinuses and mastoid air cells are otherwise clear.  Visualized thyroid is unremarkable.  Mild  degenerative changes of the cervical spine at C5-6.  Visualized brain parenchyma is unremarkable.  CT CHEST FINDINGS  Lungs are clear. No suspicious pulmonary nodules. Mild subpleural reticulation/ dependent atelectasis in the right lower lobe. No pleural effusion or pneumothorax.  Heart is normal in size.  No pericardial effusion.  Mild residual thymic tissue in the anterior mediastinum (series 2/ image 25).  No suspicious mediastinal, hilar, or axillary lymphadenopathy. Mild degenerative changes of the mid thoracic spine with a superior endplate Schmorl's node at T8 (sagittal image 94).  CT ABDOMEN AND PELVIS FINDINGS  Liver is notable for focal fat/altered perfusion along the falciform ligament (Series 2/image 59).  Spleen, pancreas, and adrenal glands are within normal limits.  Gallbladder is unremarkable. No intrahepatic or extrahepatic ductal dilatation.  Kidneys are within normal limits.  No hydronephrosis.  No evidence of bowel obstruction.  Normal appendix.  No evidence of abdominal aortic aneurysm.  No abdominopelvic ascites.  Tiny left para-aortic nodes measuring 4-5 mm short axis (series 2/ images 75 and 81), previously 6 mm, within normal limits. Prostate is unremarkable.  Bladder is mildly thick-walled although underdistended.  Status post left orchidectomy.  Very mild degenerative changes at L3-4.  IMPRESSION: Status post left orchiectomy.  Residual left para-aortic nodes measuring 4-5 mm short axis, within  normal limits, previously 6 mm.  Otherwise, no evidence of recurrent or metastatic disease in the neck, chest, or abdomen/pelvis.   Electronically Signed   By: Julian Hy M.D.   On: 09/16/2013 11:54   Ct Abdomen Pelvis W Contrast  09/16/2013   CLINICAL DATA:  Testicular cancer (seminoma), diagnosed 12/2012, status post left orchiectomy and abdominal XRT.  EXAM: CT CHEST WITH CONTRAST; CT ABDOMEN AND PELVIS WITH CONTRAST; CT NECK WITH CONTRAST  TECHNIQUE: Multidetector CT imaging of the  neck was performed with intravenous contrast.; Multidetector CT imaging of the abdomen and pelvis was performed following the standard protocol during bolus administration of intravenous contrast.; Multidetector CT imaging of the chest was performed following the standard protocol during bolus administration of intravenous contrast.  CONTRAST:  158mL OMNIPAQUE IOHEXOL 300 MG/ML  SOLN  COMPARISON:  03/19/2013  FINDINGS: CT NECK FINDINGS  Small bilateral cervical lymph nodes measuring up to 6 mm short axis on the right (series 5/image 73), within normal limits. No suspicious cervical lymphadenopathy.  Nasopharyngeal soft tissues are within normal limits. Airway remains patent.  Mild partial opacification of the bilateral maxillary sinuses. Visualized paranasal sinuses and mastoid air cells are otherwise clear.  Visualized thyroid is unremarkable.  Mild degenerative changes of the cervical spine at C5-6.  Visualized brain parenchyma is unremarkable.  CT CHEST FINDINGS  Lungs are clear. No suspicious pulmonary nodules. Mild subpleural reticulation/ dependent atelectasis in the right lower lobe. No pleural effusion or pneumothorax.  Heart is normal in size.  No pericardial effusion.  Mild residual thymic tissue in the anterior mediastinum (series 2/ image 25).  No suspicious mediastinal, hilar, or axillary lymphadenopathy. Mild degenerative changes of the mid thoracic spine with a superior endplate Schmorl's node at T8 (sagittal image 94).  CT ABDOMEN AND PELVIS FINDINGS  Liver is notable for focal fat/altered perfusion along the falciform ligament (Series 2/image 59).  Spleen, pancreas, and adrenal glands are within normal limits.  Gallbladder is unremarkable. No intrahepatic or extrahepatic ductal dilatation.  Kidneys are within normal limits.  No hydronephrosis.  No evidence of bowel obstruction.  Normal appendix.  No evidence of abdominal aortic aneurysm.  No abdominopelvic ascites.  Tiny left para-aortic nodes  measuring 4-5 mm short axis (series 2/ images 75 and 81), previously 6 mm, within normal limits. Prostate is unremarkable.  Bladder is mildly thick-walled although underdistended.  Status post left orchidectomy.  Very mild degenerative changes at L3-4.  IMPRESSION: Status post left orchiectomy.  Residual left para-aortic nodes measuring 4-5 mm short axis, within normal limits, previously 6 mm.  Otherwise, no evidence of recurrent or metastatic disease in the neck, chest, or abdomen/pelvis.   Electronically Signed   By: Julian Hy M.D.   On: 09/16/2013 11:54    Impression:  No evidence of recurrence on clinical exam today  Plan:  Routine followup in 3 months. In the interim the patient will be seen by medical oncology.  ____________________________________ Blair Promise, MD

## 2013-10-02 ENCOUNTER — Ambulatory Visit (HOSPITAL_BASED_OUTPATIENT_CLINIC_OR_DEPARTMENT_OTHER): Payer: Medicaid Other | Admitting: Oncology

## 2013-10-02 ENCOUNTER — Encounter: Payer: Self-pay | Admitting: Oncology

## 2013-10-02 ENCOUNTER — Telehealth: Payer: Self-pay | Admitting: Oncology

## 2013-10-02 VITALS — BP 135/73 | HR 83 | Temp 97.0°F | Resp 18 | Ht 73.0 in | Wt 215.9 lb

## 2013-10-02 DIAGNOSIS — C6290 Malignant neoplasm of unspecified testis, unspecified whether descended or undescended: Secondary | ICD-10-CM

## 2013-10-02 NOTE — Telephone Encounter (Signed)
gv and printed appt sched and avs fo rpt for Aug...gv pt barium °

## 2013-10-02 NOTE — Progress Notes (Signed)
Hematology and Oncology Follow Up Visit  Clifford Davis [id number] 06-20-1976 38 y.o. 10/02/2013 1:38 PM   Principle Diagnosis: 38 year old with left testicular cancer with histology showed pure seminoma diagnosed in April of 2014. He presented with a left testicular mass and a CT scan showed 3.1 x 2.2 retroperitoneal lymph node indicating stage IIB disease.   Prior Therapy: He is status post radiation therapy for a total of 35 gray to the para-aortic and pelvic lymph node completed around 02/13/2013.  Current therapy: Observation and surveillance.  Interim History:  Clifford Davis is a pleasant 38 year old gentleman with stage IIb pure seminoma. He completed radiation in 01/2013 and tolerated very well without any major toxicity. Had not reported any abdominal pain discomfort he did report some mild fatigue but otherwise have regained most activities of daily living without any hindrance or decline. Has not reported any abdominal masses. Has not reported any neck adenopathy. For the most part she performs most activities of daily living without any hindrance or decline. He continues to work full-time without any decline in his energy her performance status.  Medications: I have reviewed the patient's current medications.  Current Outpatient Prescriptions  Medication Sig Dispense Refill  . cetirizine (ZYRTEC) 10 MG tablet Take 10 mg by mouth daily.      Marland Kitchen ibuprofen (ADVIL,MOTRIN) 200 MG tablet Take 200 mg by mouth every 6 (six) hours as needed for pain.      Marland Kitchen omeprazole (PRILOSEC) 40 MG capsule Take 40 mg by mouth daily.       No current facility-administered medications for this visit.     Allergies:  Allergies  Allergen Reactions  . Bee Venom Anaphylaxis    Past Medical History, Surgical history, Social history, and Family History were reviewed and updated.  Review of Systems: Constitutional:  Negative for fever, chills, night sweats, anorexia, weight loss, pain. Cardiovascular: no  chest pain or dyspnea on exertion  Remaining ROS negative. Physical Exam: Blood pressure 135/73, pulse 83, temperature 97 F (36.1 C), temperature source Oral, resp. rate 18, height 6\' 1"  (1.854 m), weight 215 lb 14.4 oz (97.932 kg), SpO2 99.00%. ECOG: 0 General appearance: alert Head: Normocephalic, without obvious abnormality, atraumatic Neck: no adenopathy, no carotid bruit, no JVD, supple, symmetrical, trachea midline and thyroid not enlarged, symmetric, no tenderness/mass/nodules Lymph nodes: Cervical, supraclavicular, and axillary nodes normal. Heart:regular rate and rhythm, S1, S2 normal, no murmur, click, rub or gallop Lung:chest clear, no wheezing, rales, normal symmetric air entry Abdomin: soft, non-tender, without masses or organomegaly EXT:no erythema, induration, or nodules   Lab Results: Lab Results  Component Value Date   WBC 4.0 09/16/2013   HGB 15.2 09/16/2013   HCT 44.3 09/16/2013   MCV 90.5 09/16/2013   PLT 215 09/16/2013     Chemistry      Component Value Date/Time   NA 141 09/16/2013 0904   NA 140 10/03/2007 1430   K 3.9 09/16/2013 0904   K 3.2* 10/03/2007 1430   CL 105 02/19/2013 1457   CL 103 10/03/2007 1430   CO2 26 09/16/2013 0904   CO2 25 10/03/2007 1430   BUN 12.3 09/16/2013 0904   BUN 5* 10/03/2007 1430   CREATININE 0.9 09/16/2013 0904   CREATININE 0.58 10/03/2007 1430      Component Value Date/Time   CALCIUM 9.7 09/16/2013 0904   CALCIUM 9.6 10/03/2007 1430   ALKPHOS 75 09/16/2013 0904   ALKPHOS 95 10/03/2007 1430   AST 16 09/16/2013 0904   AST 16  10/03/2007 1430   ALT 39 09/16/2013 0904   ALT 17 10/03/2007 1430   BILITOT 0.73 09/16/2013 0904   BILITOT 0.9 10/03/2007 1430     EXAM:  CT CHEST WITH CONTRAST; CT ABDOMEN AND PELVIS WITH CONTRAST; CT NECK  WITH CONTRAST  TECHNIQUE:  Multidetector CT imaging of the neck was performed with intravenous  contrast.; Multidetector CT imaging of the abdomen and pelvis was  performed following the standard protocol during bolus   administration of intravenous contrast.; Multidetector CT imaging of  the chest was performed following the standard protocol during bolus  administration of intravenous contrast.  CONTRAST: 178mL OMNIPAQUE IOHEXOL 300 MG/ML SOLN  COMPARISON: 03/19/2013  FINDINGS:  CT NECK FINDINGS  Small bilateral cervical lymph nodes measuring up to 6 mm short axis  on the right (series 5/image 73), within normal limits. No  suspicious cervical lymphadenopathy.  Nasopharyngeal soft tissues are within normal limits. Airway remains  patent.  Mild partial opacification of the bilateral maxillary sinuses.  Visualized paranasal sinuses and mastoid air cells are otherwise  clear.  Visualized thyroid is unremarkable.  Mild degenerative changes of the cervical spine at C5-6.  Visualized brain parenchyma is unremarkable.  CT CHEST FINDINGS  Lungs are clear. No suspicious pulmonary nodules. Mild subpleural  reticulation/ dependent atelectasis in the right lower lobe. No  pleural effusion or pneumothorax.  Heart is normal in size. No pericardial effusion.  Mild residual thymic tissue in the anterior mediastinum (series 2/  image 25).  No suspicious mediastinal, hilar, or axillary lymphadenopathy. Mild  degenerative changes of the mid thoracic spine with a superior  endplate Schmorl's node at T8 (sagittal image 94).  CT ABDOMEN AND PELVIS FINDINGS  Liver is notable for focal fat/altered perfusion along the falciform  ligament (Series 2/image 59).  Spleen, pancreas, and adrenal glands are within normal limits.  Gallbladder is unremarkable. No intrahepatic or extrahepatic ductal  dilatation.  Kidneys are within normal limits. No hydronephrosis.  No evidence of bowel obstruction. Normal appendix.  No evidence of abdominal aortic aneurysm.  No abdominopelvic ascites.  Tiny left para-aortic nodes measuring 4-5 mm short axis (series 2/  images 75 and 81), previously 6 mm, within normal limits. Prostate  is  unremarkable.  Bladder is mildly thick-walled although underdistended.  Status post left orchidectomy.  Very mild degenerative changes at L3-4.  IMPRESSION:  Status post left orchiectomy.  Residual left para-aortic nodes measuring 4-5 mm short axis, within  normal limits, previously 6 mm.  Otherwise, no evidence of recurrent or metastatic disease in the  neck, chest, or abdomen/pelvis.     Impression and Plan:  38 year old gentleman with the following issues:  1. 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 He is status post definitive treatment with radiation therapy completed in June of 2014. His tumor markers have completely normalized prior to his radiation therapy. CT scans from 09/16/2013 discussed today and showed no disease. Clinically he is doing well without any evidence to suggest recurrent disease and the plan is to continue active surveillance at this time.I will repeat imaging studies  every 6 months with clinical visits for the first 2 years. He understands he might need systemic chemotherapy should his cancer relapses.  2. Followup: Will be in 6 months after a CT scan.   Zola Button, MD 2/5/20151:38 PM

## 2014-03-05 ENCOUNTER — Ambulatory Visit
Admission: RE | Admit: 2014-03-05 | Discharge: 2014-03-05 | Disposition: A | Discharge status: Home / Self Care | Payer: Medicaid Other | Source: Ambulatory Visit | Attending: Radiation Oncology | Admitting: Radiation Oncology

## 2014-03-05 ENCOUNTER — Encounter: Payer: Self-pay | Admitting: Radiation Oncology

## 2014-03-05 VITALS — BP 126/78 | HR 58 | Temp 98.0°F | Ht 73.0 in | Wt 220.6 lb

## 2014-03-05 DIAGNOSIS — C6292 Malignant neoplasm of left testis, unspecified whether descended or undescended: Secondary | ICD-10-CM

## 2014-03-05 NOTE — Progress Notes (Signed)
  Radiation Oncology         (336) [phone] ________________________________  Name: Clifford Davis MRN: [id number]  Date: 03/05/2014  DOB: 04-Jan-1976  Follow-Up Visit Note  CC: No PCP Per Patient  [name], MD  Diagnosis:   Stage II-B (T2, N2, M0) testicular seminoma   Interval Since Last Radiation:  One year   Narrative:  The patient returns today for routine follow-up.  He denies any new problems. The patient continues to be regular testicular exams. He denies any low back pain or abdominal pain. He denies any nausea or appetite problems. 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                             ALLERGIES:  is allergic to bee venom.  Meds: Current Outpatient Prescriptions  Medication Sig Dispense Refill  . cetirizine (ZYRTEC) 10 MG tablet Take 10 mg by mouth daily.      Marland Kitchen ibuprofen (ADVIL,MOTRIN) 200 MG tablet Take 200 mg by mouth every 6 (six) hours as needed for pain.      Marland Kitchen omeprazole (PRILOSEC) 40 MG capsule Take 40 mg by mouth daily.       No current facility-administered medications for this encounter.    Physical Findings: The patient is in no acute distress. Patient is alert and oriented.  height is 6\' 1"  (1.854 m) and weight is 220 lb 9.6 oz (100.064 kg). His oral temperature is 98 F (36.7 C). His blood pressure is 126/78 and his pulse is 58. Marland Kitchen No palpable supraclavicular or axillary adenopathy. The lungs are clear to auscultation. The heart has regular rhythm and rate. The abdomen is soft and nontender with normal bowel sounds. No palpable hepatosplenomegaly the inguinal areas are free of adenopathy. The patient's left inguinal scar shows no nodularity. Examination of the scrotum reveals the left testicle be surgically absent. The right testicle is soft without masses.  Lab Findings: Lab Results  Component Value Date   WBC 4.0 09/16/2013   HGB 15.2 09/16/2013   HCT 44.3 09/16/2013   MCV 90.5 09/16/2013   PLT 215 09/16/2013      Radiographic Findings: No results found.  Impression:  No evidence of recurrence on clinical exam today.  Plan:  Routine followup in 6 months. Patient will be seen by medical oncology with blood work and CT scans planned at that time.  ____________________________________ Blair Promise, MD

## 2014-03-05 NOTE — Progress Notes (Signed)
Clifford Davis here for follow up after treatment for testicular seminoma.  He denies pain, urinary issues, diarrhea, rectal bleeding, hematuria and fatigue.

## 2014-04-01 ENCOUNTER — Ambulatory Visit (HOSPITAL_COMMUNITY)
Admission: RE | Admit: 2014-04-01 | Discharge: 2014-04-01 | Disposition: A | Discharge status: Home / Self Care | Payer: Medicaid Other | Source: Ambulatory Visit | Attending: Oncology | Admitting: Oncology

## 2014-04-01 ENCOUNTER — Other Ambulatory Visit (HOSPITAL_BASED_OUTPATIENT_CLINIC_OR_DEPARTMENT_OTHER): Payer: Medicaid Other

## 2014-04-01 ENCOUNTER — Encounter (HOSPITAL_COMMUNITY): Payer: Self-pay

## 2014-04-01 DIAGNOSIS — C6290 Malignant neoplasm of unspecified testis, unspecified whether descended or undescended: Secondary | ICD-10-CM

## 2014-04-01 LAB — COMPREHENSIVE METABOLIC PANEL (CC13)
ALBUMIN: 4.2 g/dL (ref 3.5–5.0)
ALT: 32 U/L (ref 0–55)
ANION GAP: 8 meq/L (ref 3–11)
AST: 16 U/L (ref 5–34)
Alkaline Phosphatase: 75 U/L (ref 40–150)
BUN: 14.7 mg/dL (ref 7.0–26.0)
CALCIUM: 9.7 mg/dL (ref 8.4–10.4)
CHLORIDE: 107 meq/L (ref 98–109)
CO2: 27 meq/L (ref 22–29)
Creatinine: 1 mg/dL (ref 0.7–1.3)
GLUCOSE: 99 mg/dL (ref 70–140)
Potassium: 4 mEq/L (ref 3.5–5.1)
SODIUM: 141 meq/L (ref 136–145)
TOTAL PROTEIN: 7.1 g/dL (ref 6.4–8.3)
Total Bilirubin: 0.55 mg/dL (ref 0.20–1.20)

## 2014-04-01 LAB — CBC WITH DIFFERENTIAL/PLATELET
BASO%: 1.1 % (ref 0.0–2.0)
Basophils Absolute: 0.1 10*3/uL (ref 0.0–0.1)
EOS ABS: 0.1 10*3/uL (ref 0.0–0.5)
EOS%: 2.9 % (ref 0.0–7.0)
HEMATOCRIT: 46.3 % (ref 38.4–49.9)
HGB: 15.6 g/dL (ref 13.0–17.1)
LYMPH#: 1.4 10*3/uL (ref 0.9–3.3)
LYMPH%: 32 % (ref 14.0–49.0)
MCH: 30.6 pg (ref 27.2–33.4)
MCHC: 33.7 g/dL (ref 32.0–36.0)
MCV: 90.8 fL (ref 79.3–98.0)
MONO#: 0.3 10*3/uL (ref 0.1–0.9)
MONO%: 6.7 % (ref 0.0–14.0)
NEUT%: 57.3 % (ref 39.0–75.0)
NEUTROS ABS: 2.6 10*3/uL (ref 1.5–6.5)
PLATELETS: 255 10*3/uL (ref 140–400)
RBC: 5.1 10*6/uL (ref 4.20–5.82)
RDW: 13.3 % (ref 11.0–14.6)
WBC: 4.5 10*3/uL (ref 4.0–10.3)

## 2014-04-01 LAB — LACTATE DEHYDROGENASE (CC13): LDH: 118 U/L — AB (ref 125–245)

## 2014-04-01 MED ORDER — IOHEXOL 300 MG/ML  SOLN
100.0000 mL | Freq: Once | INTRAMUSCULAR | Status: AC | PRN
  Administered 2014-04-01: 100 mL via INTRAVENOUS

## 2014-04-03 LAB — AFP TUMOR MARKER: AFP-Tumor Marker: 3 ng/mL (ref 0.0–8.0)

## 2014-04-03 LAB — BETA HCG QUANT (REF LAB)

## 2014-04-08 ENCOUNTER — Ambulatory Visit (HOSPITAL_BASED_OUTPATIENT_CLINIC_OR_DEPARTMENT_OTHER): Payer: Medicaid Other | Admitting: Oncology

## 2014-04-08 ENCOUNTER — Telehealth: Payer: Self-pay | Admitting: Oncology

## 2014-04-08 VITALS — BP 127/82 | HR 96 | Temp 98.6°F | Resp 18 | Ht 73.0 in | Wt 218.7 lb

## 2014-04-08 DIAGNOSIS — C6290 Malignant neoplasm of unspecified testis, unspecified whether descended or undescended: Secondary | ICD-10-CM

## 2014-04-08 NOTE — Progress Notes (Signed)
Hematology and Oncology Follow Up Visit  Clifford Davis [id number] 04-18-1976 38 y.o. 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 He presented with a left testicular mass and a CT scan showed 3.1 x 2.2 retroperitoneal lymph node indicating stage IIB disease.   Prior Therapy: He is status post radiation therapy for a total of 35 gray to the para-aortic and pelvic lymph node completed around 02/13/2013.  Current therapy: Observation and surveillance.  Interim History:  Clifford Davis returns today for a followup visit. Since his last visit, he reports no new problems. He is not reporting any delayed toxicities related to radiation therapy. He continues to work full-time without any decline in his performance status.. Had not reported any abdominal pain discomfort he did report some mild fatigue but otherwise have regained most activities of daily living without any hindrance or decline. Has not reported any abdominal masses. Has not reported any neck adenopathy. He does not report any headaches or blurry vision. Did not report any neuropathy or change in his mentation. He does not report any shortness of breath or cough. Does not report any adenopathy or rashes. Rest of his review of systems unremarkable.  Medications: I have reviewed the patient's current medications.  Current Outpatient Prescriptions  Medication Sig Dispense Refill  . cetirizine (ZYRTEC) 10 MG tablet Take 10 mg by mouth daily.      Marland Kitchen ibuprofen (ADVIL,MOTRIN) 200 MG tablet Take 200 mg by mouth every 6 (six) hours as needed for pain.      Marland Kitchen omeprazole (PRILOSEC) 40 MG capsule Take 40 mg by mouth daily.       No current facility-administered medications for this visit.     Allergies:  Allergies  Allergen Reactions  . Bee Venom Anaphylaxis    Past Medical History, Surgical history, Social history, and Family History were  reviewed and updated.   Physical Exam: Blood pressure 127/82, pulse 96, temperature 98.6 F (37 C), temperature source Oral, resp. rate 18, height 6\' 1"  (1.854 m), weight 218 lb 11.2 oz (99.202 kg), SpO2 99.00%. ECOG: 0 General appearance: alert Head: Normocephalic, without obvious abnormality, atraumatic Neck: no adenopathy Lymph nodes: Cervical, supraclavicular, and axillary nodes normal. Heart:regular rate and rhythm, S1, S2 normal, no murmur, click, rub or gallop Lung:chest clear, no wheezing, rales, normal symmetric air entry Abdomin: soft, non-tender, without masses or organomegaly EXT:no erythema, induration, or nodules   Lab Results: Lab Results  Component Value Date   WBC 4.5 04/01/2014   HGB 15.6 04/01/2014   HCT 46.3 04/01/2014   MCV 90.8 04/01/2014   PLT 255 04/01/2014     Chemistry      Component Value Date/Time   NA 141 04/01/2014 0905   NA 140 10/03/2007 1430   K 4.0 04/01/2014 0905   K 3.2* 10/03/2007 1430   CL 105 02/19/2013 1457   CL 103 10/03/2007 1430   CO2 27 04/01/2014 0905   CO2 25 10/03/2007 1430   BUN 14.7 04/01/2014 0905   BUN 5* 10/03/2007 1430   CREATININE 1.0 04/01/2014 0905   CREATININE 0.58 10/03/2007 1430      Component Value Date/Time   CALCIUM 9.7 04/01/2014 0905   CALCIUM 9.6 10/03/2007 1430   ALKPHOS 75 04/01/2014 0905   ALKPHOS 95 10/03/2007 1430   AST 16 04/01/2014 0905   AST 16 10/03/2007 1430   ALT 32 04/01/2014 0905   ALT 17 10/03/2007 1430  BILITOT 0.55 04/01/2014 0905   BILITOT 0.9 10/03/2007 1430     Results for Clifford, Davis (MRN [id number]) as of 04/08/2014 09:20  Ref. Range 04/01/2014 09:05  AFP-Tumor Marker Latest Range: 0.0-8.0 ng/mL 3.0  Beta hCG, Tumor Marker Latest Range: < 5.0 mIU/mL < 2.0    EXAM:  CT CHEST, ABDOMEN, AND PELVIS WITH CONTRAST  TECHNIQUE:  Multidetector CT imaging of the chest, abdomen and pelvis was  performed following the standard protocol during bolus  administration of intravenous contrast.  CONTRAST: 181mL OMNIPAQUE IOHEXOL 300  MG/ML SOLN  COMPARISON: 09/16/2013  FINDINGS:  CT CHEST FINDINGS  No pleural effusion. No pulmonary nodule or mass identified.  The heart size appears normal. There is no pericardial effusion  identified. No enlarged mediastinal or hilar lymph nodes. No  axillary or supraclavicular adenopathy.  CT ABDOMEN AND PELVIS FINDINGS  No suspicious liver abnormality. The gallbladder appears normal.  Normal appearance of the pancreas. The spleen is unremarkable. The  adrenal glands are both normal. Normal appearance of both kidneys.  The urinary bladder appears normal. Prostate gland and seminal  vesicles are unremarkable. Status post left for Key ectomy.  Normal caliber of the abdominal aorta. No aneurysm. No  retroperitoneal adenopathy. No small bowel mesenteric adenopathy.  There is no pelvic or inguinal adenopathy identified.  The stomach is normal. The small bowel loops have a normal course  and caliber and there is no evidence for bowel obstruction. The  appendix is visualized and appears normal. Normal appearance of the  colon.  Review of the visualized osseous structures is negative for  aggressive lytic or sclerotic bone lesion.  IMPRESSION:  1. No acute findings. No evidence for metastatic disease to the  chest abdomen or pelvis.   Impression and Plan:  38 year old gentleman with the following issues:  1. 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 He is status post definitive treatment with radiation therapy completed in June of 2014. His tumor markers have completely normalized prior to his radiation therapy. CT scans from 04/01/2014 was discussed today and showed no disease. Clinically he is doing well without any evidence to suggest recurrent disease and the plan is to continue active surveillance at this time. I will repeat imaging studies every 6 months till around April of 2016. I anticipate annual CT scans after that  2. Followup: Will be in 6 months  after a CT scan.   [facility], MD 8/12/20159:28 AM

## 2014-04-08 NOTE — Telephone Encounter (Signed)
Pt confirmed labs/ov per 08/12 POF, gave pt AVS....KJ °

## 2014-07-30 ENCOUNTER — Ambulatory Visit: Payer: Medicaid Other | Admitting: Radiation Oncology

## 2014-09-10 ENCOUNTER — Ambulatory Visit
Admission: RE | Admit: 2014-09-10 | Discharge: 2014-09-10 | Disposition: A | Discharge status: Home / Self Care | Payer: Medicaid Other | Source: Ambulatory Visit | Attending: Radiation Oncology | Admitting: Radiation Oncology

## 2014-09-10 ENCOUNTER — Encounter: Payer: Self-pay | Admitting: Radiation Oncology

## 2014-09-10 VITALS — BP 131/83 | HR 83 | Temp 98.0°F | Resp 16 | Ht 73.0 in | Wt 222.9 lb

## 2014-09-10 DIAGNOSIS — C6292 Malignant neoplasm of left testis, unspecified whether descended or undescended: Secondary | ICD-10-CM

## 2014-09-10 NOTE — Progress Notes (Signed)
  Radiation Oncology         (336) [phone] ________________________________  Name: Clifford Davis MRN: [id number]  Date: 09/10/2014  DOB: 04-10-1976  Follow-Up Visit Note  CC: No PCP Per Patient  [name], MD    ICD-9-CM ICD-10-CM   1. Primary testicular seminoma, left 186.9 C62.92     Diagnosis:  Stage II-B (T2, N2, M0) testicular seminoma   Interval Since Last Radiation:  One and a half years   Narrative:  The patient returns today for routine follow-up.  He is doing well at this time. He denies any abdominal pain or back or flank pain. He denies any discomfort in the scrotal area. He denies any urination difficulties.                               ALLERGIES:  is allergic to bee venom.  Meds: Current Outpatient Prescriptions  Medication Sig Dispense Refill  . aspirin 81 MG tablet Take 81 mg by mouth daily.    . cetirizine (ZYRTEC) 10 MG tablet Take 10 mg by mouth daily.    Marland Kitchen ibuprofen (ADVIL,MOTRIN) 200 MG tablet Take 200 mg by mouth every 6 (six) hours as needed for pain.    . ranitidine (ZANTAC) 150 MG tablet Take 150 mg by mouth 2 (two) times daily.    Marland Kitchen omeprazole (PRILOSEC) 40 MG capsule Take 40 mg by mouth daily.     No current facility-administered medications for this encounter.    Physical Findings: The patient is in no acute distress. Patient is alert and oriented.  height is 6\' 1"  (1.854 m) and weight is 222 lb 14.4 oz (101.107 kg). His oral temperature is 98 F (36.7 C). His blood pressure is 131/83 and his pulse is 83. His respiration is 16. Marland Kitchen  No palpable subclavicular or axillary adenopathy. The lungs are clear to auscultation. The heart has a regular rhythm and rate. The abdomen is soft and nontender with normal bowel sounds. No palpable mass or hepatosplenomegaly. The inguinal areas are free of adenopathy. The patient is normal circumcised male. The left testicle is surgically absent. The right testicle is soft without masses  Lab Findings: Lab Results   Component Value Date   WBC 4.5 04/01/2014   HGB 15.6 04/01/2014   HCT 46.3 04/01/2014   MCV 90.8 04/01/2014   PLT 255 04/01/2014    Radiographic Findings: No results found.  Impression:  No evidence of recurrence on clinical exam today.   Plan:     The patient will undergo blood work and CT scans next month as well as medical oncology follow-up. Routine follow-up in radiation oncology in 3 months in between medical oncology follow-up.   ____________________________________ Blair Promise, MD

## 2014-09-10 NOTE — Progress Notes (Signed)
Clifford Davis here for follow up after treatment for testicular cancer.  He denies pain, bladder issues, bowel issues, hematuria and fatigue.

## 2014-10-13 ENCOUNTER — Other Ambulatory Visit (HOSPITAL_BASED_OUTPATIENT_CLINIC_OR_DEPARTMENT_OTHER): Payer: BLUE CROSS/BLUE SHIELD

## 2014-10-13 ENCOUNTER — Encounter (HOSPITAL_COMMUNITY): Payer: Self-pay

## 2014-10-13 ENCOUNTER — Ambulatory Visit (HOSPITAL_COMMUNITY)
Admission: RE | Admit: 2014-10-13 | Discharge: 2014-10-13 | Disposition: A | Discharge status: Home / Self Care | Payer: BLUE CROSS/BLUE SHIELD | Source: Ambulatory Visit | Attending: Oncology | Admitting: Oncology

## 2014-10-13 DIAGNOSIS — C6290 Malignant neoplasm of unspecified testis, unspecified whether descended or undescended: Secondary | ICD-10-CM

## 2014-10-13 DIAGNOSIS — Z9889 Other specified postprocedural states: Secondary | ICD-10-CM | POA: Insufficient documentation

## 2014-10-13 LAB — COMPREHENSIVE METABOLIC PANEL (CC13)
ALT: 39 U/L (ref 0–55)
ANION GAP: 10 meq/L (ref 3–11)
AST: 18 U/L (ref 5–34)
Albumin: 4.5 g/dL (ref 3.5–5.0)
Alkaline Phosphatase: 82 U/L (ref 40–150)
BILIRUBIN TOTAL: 0.54 mg/dL (ref 0.20–1.20)
BUN: 11.9 mg/dL (ref 7.0–26.0)
CALCIUM: 10 mg/dL (ref 8.4–10.4)
CHLORIDE: 106 meq/L (ref 98–109)
CO2: 26 meq/L (ref 22–29)
CREATININE: 0.9 mg/dL (ref 0.7–1.3)
GLUCOSE: 105 mg/dL (ref 70–140)
Potassium: 3.9 mEq/L (ref 3.5–5.1)
Sodium: 142 mEq/L (ref 136–145)
Total Protein: 7.4 g/dL (ref 6.4–8.3)

## 2014-10-13 LAB — LACTATE DEHYDROGENASE (CC13): LDH: 134 U/L (ref 125–245)

## 2014-10-13 LAB — CBC WITH DIFFERENTIAL/PLATELET
BASO%: 0.8 % (ref 0.0–2.0)
Basophils Absolute: 0 10*3/uL (ref 0.0–0.1)
EOS ABS: 0.2 10*3/uL (ref 0.0–0.5)
EOS%: 4.5 % (ref 0.0–7.0)
HEMATOCRIT: 48.1 % (ref 38.4–49.9)
HEMOGLOBIN: 16 g/dL (ref 13.0–17.1)
LYMPH#: 1.4 10*3/uL (ref 0.9–3.3)
LYMPH%: 32.5 % (ref 14.0–49.0)
MCH: 30.3 pg (ref 27.2–33.4)
MCHC: 33.2 g/dL (ref 32.0–36.0)
MCV: 91.5 fL (ref 79.3–98.0)
MONO#: 0.3 10*3/uL (ref 0.1–0.9)
MONO%: 6.9 % (ref 0.0–14.0)
NEUT%: 55.3 % (ref 39.0–75.0)
NEUTROS ABS: 2.3 10*3/uL (ref 1.5–6.5)
PLATELETS: 298 10*3/uL (ref 140–400)
RBC: 5.26 10*6/uL (ref 4.20–5.82)
RDW: 13 % (ref 11.0–14.6)
WBC: 4.2 10*3/uL (ref 4.0–10.3)

## 2014-10-13 MED ORDER — IOHEXOL 300 MG/ML  SOLN
100.0000 mL | Freq: Once | INTRAMUSCULAR | Status: AC | PRN
  Administered 2014-10-13: 100 mL via INTRAVENOUS

## 2014-10-14 LAB — BETA HCG QUANT (REF LAB): Beta hCG, Tumor Marker: <2 m[IU]/mL (ref <5.0)

## 2014-10-14 LAB — AFP TUMOR MARKER-PREVIOUS METHOD: AFP Tumor Marker: 5.1 ng/mL (ref 0.0–8.0)

## 2014-10-14 LAB — AFP TUMOR MARKER: AFP TUMOR MARKER: 4.2 ng/mL (ref <6.1)

## 2014-10-15 ENCOUNTER — Ambulatory Visit (HOSPITAL_BASED_OUTPATIENT_CLINIC_OR_DEPARTMENT_OTHER): Payer: BLUE CROSS/BLUE SHIELD | Admitting: Oncology

## 2014-10-15 ENCOUNTER — Telehealth: Payer: Self-pay | Admitting: Oncology

## 2014-10-15 VITALS — BP 123/71 | HR 68 | Temp 98.0°F | Resp 18 | Ht 73.0 in | Wt 220.3 lb

## 2014-10-15 DIAGNOSIS — C6290 Malignant neoplasm of unspecified testis, unspecified whether descended or undescended: Secondary | ICD-10-CM

## 2014-10-15 DIAGNOSIS — C6291 Malignant neoplasm of right testis, unspecified whether descended or undescended: Secondary | ICD-10-CM

## 2014-10-15 NOTE — Progress Notes (Signed)
Hematology and Oncology Follow Up Visit  Clifford Davis [id number] 03-Oct-1975 39 y.o. 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 He presented with a left testicular mass and a CT scan showed 3.1 x 2.2 retroperitoneal lymph node indicating stage IIB disease.   Prior Therapy: He is status post radiation therapy for a total of 35 gray to the para-aortic and pelvic lymph node completed around 02/13/2013.  Current therapy: Observation and surveillance.  Interim History:  Mr. Clifford Davis returns today for a followup visit. Since his last visit, he continues to do very well. He reports no complaints and has not reported any delayed toxicities related to radiation therapy. He continues to work full-time without any decline in his performance status. Had not reported any abdominal pain discomfort. Has not reported any abdominal masses. Has not reported any neck adenopathy. He does not report any headaches or blurry vision. Did not report any neuropathy or change in his mentation. He does not report any shortness of breath or cough. Does not report any adenopathy or rashes. He did not report any chest pain, palpitation orthopnea. He does not report any frequency, urgency or hematuria. Rest of his review of systems unremarkable.  Medications: I have reviewed the patient's current medications.  Current Outpatient Prescriptions  Medication Sig Dispense Refill  . aspirin 81 MG tablet Take 81 mg by mouth daily.    . cetirizine (ZYRTEC) 10 MG tablet Take 10 mg by mouth daily.    Marland Kitchen ibuprofen (ADVIL,MOTRIN) 200 MG tablet Take 200 mg by mouth every 6 (six) hours as needed for pain.    Marland Kitchen omeprazole (PRILOSEC) 40 MG capsule Take 40 mg by mouth daily.    . ranitidine (ZANTAC) 150 MG tablet Take 150 mg by mouth 2 (two) times daily.     No current facility-administered medications for this visit.     Allergies:   Allergies  Allergen Reactions  . Bee Venom Anaphylaxis    Past Medical History, Surgical history, Social history, and Family History were reviewed and updated.   Physical Exam: Blood pressure 123/71, pulse 68, temperature 98 F (36.7 C), temperature source Oral, resp. rate 18, height 6\' 1"  (1.854 m), weight 220 lb 4.8 oz (99.927 kg). ECOG: 0 General appearance: alert awake not in any distress. Head: Normocephalic, without obvious abnormality. Neck: no adenopathy Lymph nodes: Cervical, supraclavicular, and axillary nodes normal. Heart:regular rate and rhythm, S1, S2 normal, no murmur, click, rub or gallop Lung:chest clear, no wheezing, rales, normal symmetric air entry Abdomin: soft, non-tender, without masses or organomegaly EXT:no erythema, induration, or nodules   Lab Results: Lab Results  Component Value Date   WBC 4.2 10/13/2014   HGB 16.0 10/13/2014   HCT 48.1 10/13/2014   MCV 91.5 10/13/2014   PLT 298 10/13/2014     Chemistry      Component Value Date/Time   NA 142 10/13/2014 0909   NA 140 10/03/2007 1430   K 3.9 10/13/2014 0909   K 3.2* 10/03/2007 1430   CL 105 02/19/2013 1457   CL 103 10/03/2007 1430   CO2 26 10/13/2014 0909   CO2 25 10/03/2007 1430   BUN 11.9 10/13/2014 0909   BUN 5* 10/03/2007 1430   CREATININE 0.9 10/13/2014 0909   CREATININE 0.58 10/03/2007 1430      Component Value Date/Time   CALCIUM 10.0 10/13/2014 0909   CALCIUM 9.6 10/03/2007 1430   ALKPHOS 82 10/13/2014  0909   ALKPHOS 95 10/03/2007 1430   AST 18 10/13/2014 0909   AST 16 10/03/2007 1430   ALT 39 10/13/2014 0909   ALT 17 10/03/2007 1430   BILITOT 0.54 10/13/2014 0909   BILITOT 0.9 10/03/2007 1430      Results for Clifford Davis (MRN [id number]) as of 10/15/2014 09:09  Ref. Range 10/13/2014 09:09  AFP Tumor Marker Latest Range: <6.1 ng/mL 4.2  Beta hCG, Tumor Marker Latest Range: < 5.0 mIU/mL < 2.0    EXAM: CT CHEST, ABDOMEN, AND PELVIS WITH  CONTRAST  TECHNIQUE: Multidetector CT imaging of the chest, abdomen and pelvis was performed following the standard protocol during bolus administration of intravenous contrast.  CONTRAST: 1105mL OMNIPAQUE IOHEXOL 300 MG/ML SOLN  COMPARISON: None.  FINDINGS: CT CHEST FINDINGS  Mediastinum: Normal heart size. There is no pericardial effusion. The trachea appears patent and is midline. Normal appearance of the esophagus. There is no mediastinal or hilar adenopathy identified. No axillary or supraclavicular adenopathy.  Lungs/Pleura: No pleural effusion identified. No airspace consolidation or atelectasis. No suspicious pulmonary nodule or mass identified.  Musculoskeletal: No aggressive lytic or sclerotic bone lesions identified.  CT ABDOMEN AND PELVIS FINDINGS  Hepatobiliary: No suspicious liver lesions identified. Tiny low attenuation structure within the inferior right hepatic lobe is identified measuring 4 mm, image 77/series 2. Unchanged from previous exam. Likely small cyst. The gallbladder appears normal. No biliary dilatation.  Pancreas: Negative  Spleen: Negative  Adrenals/Urinary Tract: Normal adrenal glands. Normal appearance of both kidneys. The urinary bladder appears normal.  Stomach/Bowel: The stomach and the small bowel loops have a normal course and caliber. There is no bowel obstruction. The appendix is visualized and appears normal. Normal appearance of the colon.  Vascular/Lymphatic: Normal appearance of the abdominal aorta. No enlarged retroperitoneal or mesenteric adenopathy. No enlarged pelvic or inguinal lymph nodes.  Reproductive: The prostate gland and seminal vesicles appear normal.  Other: There is no ascites or focal fluid collections within the abdomen or pelvis.  Musculoskeletal: No aggressive lytic or sclerotic bone lesions.  IMPRESSION: 1. No evidence for residual tumor or metastatic disease within the chest,  abdomen, and pelvis.    39 year old gentleman with the following issues:  1. 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 He is status post definitive treatment with radiation therapy completed in June of 2014. His tumor markers have completely normalized prior to his radiation therapy. CT scans from 10/13/2013 was discussed today and showed no disease. Clinically he is doing well without any evidence to suggest recurrent disease and the plan is to continue active surveillance at this time. I will continue with clinical visits and laboratory testing every 6 months and imaging studies on an annual basis.  2. Followup: Will be in 6 months.    [facility], MD 2/18/20169:16 AM

## 2014-10-15 NOTE — Telephone Encounter (Signed)
gv and printed appt sched and avs for pt for Aug °

## 2014-12-10 ENCOUNTER — Encounter: Payer: Self-pay | Admitting: Radiation Oncology

## 2014-12-10 ENCOUNTER — Ambulatory Visit
Admission: RE | Admit: 2014-12-10 | Discharge: 2014-12-10 | Disposition: A | Discharge status: Home / Self Care | Payer: Medicaid Other | Source: Ambulatory Visit | Attending: Radiation Oncology | Admitting: Radiation Oncology

## 2014-12-10 VITALS — BP 140/84 | HR 67 | Temp 98.4°F | Resp 16 | Ht 73.0 in | Wt 221.5 lb

## 2014-12-10 DIAGNOSIS — C6290 Malignant neoplasm of unspecified testis, unspecified whether descended or undescended: Secondary | ICD-10-CM

## 2014-12-10 DIAGNOSIS — C6292 Malignant neoplasm of left testis, unspecified whether descended or undescended: Secondary | ICD-10-CM

## 2014-12-10 NOTE — Progress Notes (Signed)
  Radiation Oncology         [phone]) [phone] ________________________________  Name: Clifford Davis MRN: [id number]  Date: 12/10/2014  DOB: 1976/06/14  Follow-Up Visit Note  CC: No PCP Per Patient  [name], MD    ICD-9-CM ICD-10-CM   1. Testicular carcinoma, unspecified laterality 186.9 C62.90   2. Primary testicular seminoma, left 186.9 C62.92     Diagnosis:  Testicular seminoma, Stage II-B (T2, N2, M0)  Interval Since Last Radiation:  One year and 10 months  Narrative:  The patient returns today for routine follow-up.  He is doing well and without complaints. He continues to work full-time in the [employer]. Patient did undergo CT scan of the chest abdomen and pelvis in February of this year showing no evidence of recurrence or metastasis. He continues to follow-up with medical oncology on a regular basis.                              ALLERGIES:  is allergic to bee venom.  Meds: Current Outpatient Prescriptions  Medication Sig Dispense Refill  . aspirin 81 MG tablet Take 81 mg by mouth daily.    . cetirizine (ZYRTEC) 10 MG tablet Take 10 mg by mouth daily.    Marland Kitchen ibuprofen (ADVIL,MOTRIN) 200 MG tablet Take 200 mg by mouth every 6 (six) hours as needed for pain.    Marland Kitchen omeprazole (PRILOSEC) 40 MG capsule Take 40 mg by mouth daily.    . ranitidine (ZANTAC) 150 MG tablet Take 150 mg by mouth 2 (two) times daily.     No current facility-administered medications for this encounter.    Physical Findings: The patient is in no acute distress. Patient is alert and oriented.  height is 6\' 1"  (1.854 m) and weight is 221 lb 8 oz (100.472 kg). His oral temperature is 98.4 F (36.9 C). His blood pressure is 140/84 and his pulse is 67. His respiration is 16. Marland Kitchen  No palpable subclavicular or axillary adenopathy. The lungs are clear to auscultation. The heart has a regular rhythm and rate. The abdomen is soft and nontender with normal bowel sounds. No inguinal adenopathy is appreciated. The  right testicle is soft without masses. The left testicle is surgically absent. No masses palpated in the scrotal sac  Lab Findings: Lab Results  Component Value Date   WBC 4.2 10/13/2014   HGB 16.0 10/13/2014   HCT 48.1 10/13/2014   MCV 91.5 10/13/2014   PLT 298 10/13/2014    Radiographic Findings: No results found.  Impression:  No evidence of recurrence on clinical exam today and on recent CT scans.  Plan:  Routine follow-up in November of this year. The patient will be seen by medical oncology in the interim.  ____________________________________ Blair Promise, MD

## 2014-12-10 NOTE — Progress Notes (Signed)
Clifford Davis here for follow up.  He dies pain.  He denies any bladder issues.  He reports getting up once per night.  He denies dysuria, hematuria and fatigue.  He is working full time and goes to the gym every day.  BP 140/84 mmHg  Pulse 67  Temp(Src) 98.4 F (36.9 C) (Oral)  Resp 16  Ht 6\' 1"  (1.854 m)  Wt 221 lb 8 oz (100.472 kg)  BMI 29.23 kg/m2

## 2015-03-30 ENCOUNTER — Telehealth: Payer: Self-pay | Admitting: Oncology

## 2015-03-30 NOTE — Telephone Encounter (Signed)
Pt called to r/s due to other apt, pt confirmed labs/ov .... KJ

## 2015-04-01 ENCOUNTER — Ambulatory Visit: Payer: BLUE CROSS/BLUE SHIELD | Admitting: Oncology

## 2015-04-01 ENCOUNTER — Other Ambulatory Visit: Payer: BLUE CROSS/BLUE SHIELD

## 2015-04-13 ENCOUNTER — Other Ambulatory Visit (HOSPITAL_BASED_OUTPATIENT_CLINIC_OR_DEPARTMENT_OTHER): Payer: BLUE CROSS/BLUE SHIELD

## 2015-04-13 ENCOUNTER — Telehealth: Payer: Self-pay | Admitting: Oncology

## 2015-04-13 ENCOUNTER — Ambulatory Visit (HOSPITAL_BASED_OUTPATIENT_CLINIC_OR_DEPARTMENT_OTHER): Payer: BLUE CROSS/BLUE SHIELD | Admitting: Oncology

## 2015-04-13 VITALS — BP 126/80 | HR 71 | Temp 98.3°F | Resp 16 | Ht 73.0 in | Wt 221.3 lb

## 2015-04-13 DIAGNOSIS — C6290 Malignant neoplasm of unspecified testis, unspecified whether descended or undescended: Secondary | ICD-10-CM

## 2015-04-13 DIAGNOSIS — C6292 Malignant neoplasm of left testis, unspecified whether descended or undescended: Secondary | ICD-10-CM

## 2015-04-13 LAB — COMPREHENSIVE METABOLIC PANEL (CC13)
ALK PHOS: 67 U/L (ref 40–150)
ALT: 30 U/L (ref 0–55)
AST: 18 U/L (ref 5–34)
Albumin: 4.2 g/dL (ref 3.5–5.0)
Anion Gap: 8 mEq/L (ref 3–11)
BILIRUBIN TOTAL: 0.52 mg/dL (ref 0.20–1.20)
BUN: 16 mg/dL (ref 7.0–26.0)
CO2: 24 meq/L (ref 22–29)
Calcium: 9.3 mg/dL (ref 8.4–10.4)
Chloride: 107 mEq/L (ref 98–109)
Creatinine: 1 mg/dL (ref 0.7–1.3)
Glucose: 99 mg/dl (ref 70–140)
Potassium: 3.8 mEq/L (ref 3.5–5.1)
Sodium: 139 mEq/L (ref 136–145)
TOTAL PROTEIN: 6.8 g/dL (ref 6.4–8.3)

## 2015-04-13 LAB — CBC WITH DIFFERENTIAL/PLATELET
BASO%: 0.9 % (ref 0.0–2.0)
BASOS ABS: 0 10*3/uL (ref 0.0–0.1)
EOS ABS: 0.1 10*3/uL (ref 0.0–0.5)
EOS%: 2.6 % (ref 0.0–7.0)
HCT: 42 % (ref 38.4–49.9)
HGB: 14.7 g/dL (ref 13.0–17.1)
LYMPH%: 38.6 % (ref 14.0–49.0)
MCH: 31.4 pg (ref 27.2–33.4)
MCHC: 35 g/dL (ref 32.0–36.0)
MCV: 89.7 fL (ref 79.3–98.0)
MONO#: 0.3 10*3/uL (ref 0.1–0.9)
MONO%: 6.7 % (ref 0.0–14.0)
NEUT%: 51.2 % (ref 39.0–75.0)
NEUTROS ABS: 2.4 10*3/uL (ref 1.5–6.5)
PLATELETS: 225 10*3/uL (ref 140–400)
RBC: 4.68 10*6/uL (ref 4.20–5.82)
RDW: 12.9 % (ref 11.0–14.6)
WBC: 4.7 10*3/uL (ref 4.0–10.3)
lymph#: 1.8 10*3/uL (ref 0.9–3.3)

## 2015-04-13 LAB — LACTATE DEHYDROGENASE (CC13): LDH: 131 U/L (ref 125–245)

## 2015-04-13 NOTE — Addendum Note (Signed)
Addended by: Randolm Idol on: 04/13/2015 02:43 PM   Modules accepted: Medications

## 2015-04-13 NOTE — Telephone Encounter (Signed)
Gave patient avs report and appointments for February. [facility] will contact patient re ct 2-4 weeks prior to expected.

## 2015-04-13 NOTE — Progress Notes (Signed)
Hematology and Oncology Follow Up Visit  SADIK PIASCIK [id number] Dec 03, 1975 39 y.o. 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 He presented with a left testicular mass and a CT scan showed 3.1 x 2.2 retroperitoneal lymph node indicating stage IIB disease.   Prior Therapy: He is status post radiation therapy for a total of 35 gray to the para-aortic and pelvic lymph node completed around 02/13/2013.  Current therapy: Observation and surveillance.  Interim History:  Mr. Sires returns today for a followup visit. Since his last visit, he reports no complications.He delayed toxicities related to radiation therapy. He continues to work full-time without any decline in his performance status.  He does not report any testicular pain or penile discharge.Had not reported any abdominal pain discomfort. Has not reported any abdominal masses. Has not reported any neck adenopathy. He does not report any headaches or blurry vision. Did not report any neuropathy or change in his mentation. He does not report any shortness of breath or cough. Does not report any adenopathy or rashes. He did not report any chest pain, palpitation orthopnea. He does not report any frequency, urgency or hematuria. Rest of his review of systems unremarkable.  Medications: I have reviewed the patient's current medications.  Current Outpatient Prescriptions  Medication Sig Dispense Refill  . aspirin 81 MG tablet Take 81 mg by mouth daily.    . cetirizine (ZYRTEC) 10 MG tablet Take 10 mg by mouth daily.    Marland Kitchen ibuprofen (ADVIL,MOTRIN) 200 MG tablet Take 200 mg by mouth every 6 (six) hours as needed for pain.    Marland Kitchen omeprazole (PRILOSEC) 40 MG capsule Take 40 mg by mouth daily.    . ranitidine (ZANTAC) 150 MG tablet Take 150 mg by mouth 2 (two) times daily.     No current facility-administered medications for this visit.      Allergies:  Allergies  Allergen Reactions  . Bee Venom Anaphylaxis    Past Medical History, Surgical history, Social history, and Family History were reviewed and updated.   Physical Exam: Blood pressure 126/80, pulse 71, temperature 98.3 F (36.8 C), temperature source Oral, resp. rate 16, height 6\' 1"  (1.854 m), weight 221 lb 4.8 oz (100.381 kg), SpO2 97 %. ECOG: 0 General appearance: alert awake  Healthy-appearing man not in any distress. Head: Normocephalic, without obvious abnormality. Neck: no adenopathy Lymph nodes: Cervical, supraclavicular, and axillary nodes normal. Heart:regular rate and rhythm, S1, S2 normal, no murmur, click, rub or gallop Lung:chest clear, no wheezing, rales, normal symmetric air entry Abdomin: soft, non-tender, without masses or organomegaly EXT:no erythema, induration, or nodules   Lab Results: Lab Results  Component Value Date   WBC 4.7 04/13/2015   HGB 14.7 04/13/2015   HCT 42.0 04/13/2015   MCV 89.7 04/13/2015   PLT 225 04/13/2015     Chemistry      Component Value Date/Time   NA 139 04/13/2015 1355   NA 140 10/03/2007 1430   K 3.8 04/13/2015 1355   K 3.2* 10/03/2007 1430   CL 105 02/19/2013 1457   CL 103 10/03/2007 1430   CO2 24 04/13/2015 1355   CO2 25 10/03/2007 1430   BUN 16.0 04/13/2015 1355   BUN 5* 10/03/2007 1430   CREATININE 1.0 04/13/2015 1355   CREATININE 0.58 10/03/2007 1430      Component Value Date/Time   CALCIUM 9.3 04/13/2015 1355   CALCIUM 9.6 10/03/2007 1430  ALKPHOS 67 04/13/2015 1355   ALKPHOS 95 10/03/2007 1430   AST 18 04/13/2015 1355   AST 16 10/03/2007 1430   ALT 30 04/13/2015 1355   ALT 17 10/03/2007 1430   BILITOT 0.52 04/13/2015 1355   BILITOT 0.9 10/03/2007 1430         EXAM: CT CHEST, ABDOMEN, AND PELVIS WITH CONTRAST  TECHNIQUE: Multidetector CT imaging of the chest, abdomen and pelvis was performed following the standard protocol during bolus administration of  intravenous contrast.  CONTRAST: 133mL OMNIPAQUE IOHEXOL 300 MG/ML SOLN  COMPARISON: None.  FINDINGS: CT CHEST FINDINGS  Mediastinum: Normal heart size. There is no pericardial effusion. The trachea appears patent and is midline. Normal appearance of the esophagus. There is no mediastinal or hilar adenopathy identified. No axillary or supraclavicular adenopathy.  Lungs/Pleura: No pleural effusion identified. No airspace consolidation or atelectasis. No suspicious pulmonary nodule or mass identified.  Musculoskeletal: No aggressive lytic or sclerotic bone lesions identified.  CT ABDOMEN AND PELVIS FINDINGS  Hepatobiliary: No suspicious liver lesions identified. Tiny low attenuation structure within the inferior right hepatic lobe is identified measuring 4 mm, image 77/series 2. Unchanged from previous exam. Likely small cyst. The gallbladder appears normal. No biliary dilatation.  Pancreas: Negative  Spleen: Negative  Adrenals/Urinary Tract: Normal adrenal glands. Normal appearance of both kidneys. The urinary bladder appears normal.  Stomach/Bowel: The stomach and the small bowel loops have a normal course and caliber. There is no bowel obstruction. The appendix is visualized and appears normal. Normal appearance of the colon.  Vascular/Lymphatic: Normal appearance of the abdominal aorta. No enlarged retroperitoneal or mesenteric adenopathy. No enlarged pelvic or inguinal lymph nodes.  Reproductive: The prostate gland and seminal vesicles appear normal.  Other: There is no ascites or focal fluid collections within the abdomen or pelvis.  Musculoskeletal: No aggressive lytic or sclerotic bone lesions.  IMPRESSION: 1. No evidence for residual tumor or metastatic disease within the chest, abdomen, and pelvis.    39 year old gentleman with the following issues:  1. 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 He is  status post definitive treatment with radiation therapy completed in June of 2014.   His tumor markers have completely normalized prior to his radiation therapy. CT scans from 10/13/2013  Was reviewed again and did not show any evidence of disease.Clinically he is doing well without any evidence to suggest recurrent disease and the plan is to continue active surveillance at this time. I will continue with clinical visits and laboratory testing every 6 months and imaging studies on an annual basis.  This will continue to year 5 and after that he will need annual follow-ups.  2. Followup: Will be in 6 months.    Zola Button, MD 8/16/20162:36 PM

## 2015-04-16 LAB — AFP TUMOR MARKER: AFP-Tumor Marker: 3.4 ng/mL (ref <6.1)

## 2015-04-16 LAB — BETA HCG QUANT (REF LAB)

## 2015-07-15 ENCOUNTER — Ambulatory Visit: Payer: BLUE CROSS/BLUE SHIELD | Admitting: Radiation Oncology

## 2015-08-12 ENCOUNTER — Ambulatory Visit
Admission: RE | Admit: 2015-08-12 | Discharge: 2015-08-12 | Disposition: A | Discharge status: Home / Self Care | Payer: BLUE CROSS/BLUE SHIELD | Source: Ambulatory Visit | Attending: Radiation Oncology | Admitting: Radiation Oncology

## 2015-08-12 ENCOUNTER — Encounter: Payer: Self-pay | Admitting: Radiation Oncology

## 2015-08-12 VITALS — BP 125/79 | HR 65 | Temp 97.4°F | Ht 73.0 in | Wt 221.4 lb

## 2015-08-12 DIAGNOSIS — C6292 Malignant neoplasm of left testis, unspecified whether descended or undescended: Secondary | ICD-10-CM

## 2015-08-12 NOTE — Progress Notes (Signed)
Clifford Davis presents for follow up of radiation completed 02/13/2013 to his Left pelvis and periaortic area. He is doing well, denies pain, and has no other complaints. He has a CT scan scheduled for 10/07/15 and will see Dr. Alen Blew on 10/14/15 for the results.   BP 125/79 mmHg  Pulse 65  Temp(Src) 97.4 F (36.3 C)  Ht 6\' 1"  (1.854 m)  Wt 221 lb 6.4 oz (100.426 kg)  BMI 29.22 kg/m2   Wt Readings from Last 3 Encounters:  08/12/15 221 lb 6.4 oz (100.426 kg)  04/13/15 221 lb 4.8 oz (100.381 kg)  12/10/14 221 lb 8 oz (100.472 kg)

## 2015-08-12 NOTE — Progress Notes (Signed)
  Radiation Oncology         ([phone]) [phone] ________________________________  Name: Clifford Davis MRN: [id number]  Date: 08/12/2015  DOB: 10-23-1975  Follow-Up Visit Note  CC: No PCP Per Patient  Clifford Portela, MD   Diagnosis:  Testicular seminoma, Stage II-B (T2, N2, M0)  Interval Since Last Radiation: Two years and six months  Narrative:  The patient returns today for routine follow-up.  He is doing well, denies pain, nausea, and has no other complaints. He is doing monthly testicular exams.  ALLERGIES:  is allergic to bee venom.  Meds: Current Outpatient Prescriptions  Medication Sig Dispense Refill  . aspirin 81 MG tablet Take 81 mg by mouth daily.    . cetirizine (ZYRTEC) 10 MG tablet Take 10 mg by mouth daily.    Marland Kitchen ibuprofen (ADVIL,MOTRIN) 200 MG tablet Take 200 mg by mouth every 6 (six) hours as needed for pain.    Marland Kitchen omeprazole (PRILOSEC) 40 MG capsule Take 40 mg by mouth daily.    . ranitidine (ZANTAC) 150 MG tablet Take 150 mg by mouth 2 (two) times daily.     No current facility-administered medications for this encounter.    Physical Findings: The patient is in no acute distress. Patient is alert and oriented.  height is 6\' 1"  (1.854 m) and weight is 221 lb 6.4 oz (100.426 kg). His temperature is 97.4 F (36.3 C). His blood pressure is 125/79 and his pulse is 65. Marland Kitchen   No palpable subclavicular or axillary adenopathy. The lungs are clear to auscultation. The heart has a regular rhythm and rate. The abdomen is soft and nontender with normal bowel sounds. No inguinal adenopathy is appreciated. The right testicle is soft without masses. The left testicle is surgically absent. No masses palpated in the scrotal sac.  Lab Findings: Lab Results  Component Value Date   WBC 4.7 04/13/2015   HGB 14.7 04/13/2015   HCT 42.0 04/13/2015   MCV 89.7 04/13/2015   PLT 225 04/13/2015    Radiographic Findings: No results found.  Impression:  No evidence of recurrence on  clinical exam today and on recent CT scans.  Plan: He has a CT scan scheduled for 10/07/15 and will see Dr. Alen Blew on 10/14/15 for the results. I discussed with the patient he can follow up exclusively with Dr. Alen Blew as long as he gets periodic testicular exams by him. If not, I will continue to follow up with the patient. Patient will call to schedule follow up if Dr. Alen Blew does not do the periodic testicular exams.  ____________________________________ -----------------------------------  Blair Promise, PhD, MD    This document serves as a record of services personally performed by Gery Pray, MD. It was created on his behalf by Lendon Collar, a trained medical scribe. The creation of this record is based on the scribe's personal observations and the provider's statements to them. This document has been checked and approved by the attending provider.

## 2015-10-07 ENCOUNTER — Other Ambulatory Visit (HOSPITAL_BASED_OUTPATIENT_CLINIC_OR_DEPARTMENT_OTHER): Payer: BLUE CROSS/BLUE SHIELD

## 2015-10-07 ENCOUNTER — Ambulatory Visit (HOSPITAL_COMMUNITY)
Admission: RE | Admit: 2015-10-07 | Discharge: 2015-10-07 | Disposition: A | Discharge status: Home / Self Care | Payer: BLUE CROSS/BLUE SHIELD | Source: Ambulatory Visit | Attending: Oncology | Admitting: Oncology

## 2015-10-07 ENCOUNTER — Encounter (HOSPITAL_COMMUNITY): Payer: Self-pay

## 2015-10-07 DIAGNOSIS — C6290 Malignant neoplasm of unspecified testis, unspecified whether descended or undescended: Secondary | ICD-10-CM | POA: Insufficient documentation

## 2015-10-07 LAB — CBC WITH DIFFERENTIAL/PLATELET
BASO%: 1.1 % (ref 0.0–2.0)
BASOS ABS: 0 10*3/uL (ref 0.0–0.1)
EOS ABS: 0.1 10*3/uL (ref 0.0–0.5)
EOS%: 3.4 % (ref 0.0–7.0)
HCT: 45.3 % (ref 38.4–49.9)
HGB: 15.4 g/dL (ref 13.0–17.1)
LYMPH%: 37 % (ref 14.0–49.0)
MCH: 30.4 pg (ref 27.2–33.4)
MCHC: 34 g/dL (ref 32.0–36.0)
MCV: 89.2 fL (ref 79.3–98.0)
MONO#: 0.2 10*3/uL (ref 0.1–0.9)
MONO%: 6 % (ref 0.0–14.0)
NEUT#: 2.2 10*3/uL (ref 1.5–6.5)
NEUT%: 52.5 % (ref 39.0–75.0)
PLATELETS: 248 10*3/uL (ref 140–400)
RBC: 5.07 10*6/uL (ref 4.20–5.82)
RDW: 13.2 % (ref 11.0–14.6)
WBC: 4.1 10*3/uL (ref 4.0–10.3)
lymph#: 1.5 10*3/uL (ref 0.9–3.3)

## 2015-10-07 LAB — COMPREHENSIVE METABOLIC PANEL
ALBUMIN: 4.4 g/dL (ref 3.5–5.0)
ALK PHOS: 74 U/L (ref 40–150)
ALT: 29 U/L (ref 0–55)
ANION GAP: 9 meq/L (ref 3–11)
AST: 16 U/L (ref 5–34)
BUN: 16.1 mg/dL (ref 7.0–26.0)
CALCIUM: 9.5 mg/dL (ref 8.4–10.4)
CO2: 25 mEq/L (ref 22–29)
Chloride: 107 mEq/L (ref 98–109)
Creatinine: 1 mg/dL (ref 0.7–1.3)
Glucose: 100 mg/dl (ref 70–140)
POTASSIUM: 4.1 meq/L (ref 3.5–5.1)
Sodium: 141 mEq/L (ref 136–145)
Total Bilirubin: 0.54 mg/dL (ref 0.20–1.20)
Total Protein: 7.2 g/dL (ref 6.4–8.3)

## 2015-10-07 LAB — LACTATE DEHYDROGENASE: LDH: 126 U/L (ref 125–245)

## 2015-10-07 MED ORDER — IOHEXOL 300 MG/ML  SOLN
100.0000 mL | Freq: Once | INTRAMUSCULAR | Status: AC | PRN
  Administered 2015-10-07: 100 mL via INTRAVENOUS

## 2015-10-08 LAB — BETA HCG QUANT (REF LAB): hCG Quant: <1 m[IU]/mL (ref 0–3)

## 2015-10-08 LAB — AFP TUMOR MARKER: AFP, SERUM, TUMOR MARKER: 4.7 ng/mL (ref 0.0–8.3)

## 2015-10-12 ENCOUNTER — Ambulatory Visit (HOSPITAL_BASED_OUTPATIENT_CLINIC_OR_DEPARTMENT_OTHER): Payer: BLUE CROSS/BLUE SHIELD | Admitting: Oncology

## 2015-10-12 ENCOUNTER — Telehealth: Payer: Self-pay | Admitting: Oncology

## 2015-10-12 VITALS — BP 134/81 | HR 82 | Temp 98.2°F | Resp 18 | Ht 73.0 in | Wt 219.6 lb

## 2015-10-12 DIAGNOSIS — C6290 Malignant neoplasm of unspecified testis, unspecified whether descended or undescended: Secondary | ICD-10-CM

## 2015-10-12 DIAGNOSIS — Z8547 Personal history of malignant neoplasm of testis: Secondary | ICD-10-CM | POA: Diagnosis not present

## 2015-10-12 NOTE — Telephone Encounter (Signed)
Pt confirmed labs/ov per 02/14 POF, gave pt AVS and Calendar... KJ °

## 2015-10-12 NOTE — Progress Notes (Signed)
Hematology and Oncology Follow Up Visit  Clifford Davis [id number] 09-04-1975 40 y.o. 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 He presented with a left testicular mass and a CT scan showed 3.1 x 2.2 retroperitoneal lymph node indicating stage IIB disease.   Prior Therapy: He is status post radiation therapy for a total of 35 gray to the para-aortic and pelvic lymph node completed around 02/13/2013.  Current therapy: Observation and surveillance.  Interim History:  Mr. Clifford Davis returns today for a followup visit by him self. Since his last visit, he continues to do well. He reports no signs or symptoms of cancer recurrence or delayed complications to radiation. He continues to work full-time without any decline in his performance status.  He does not report any testicular pain or penile discharge. Had not reported any abdominal pain discomfort. Has not reported any abdominal masses. Has not reported any neck adenopathy.    He does not report any headaches or blurry vision, syncope or seizures. Did not report any neuropathy or change in his mentation. He does not report any fevers or chills or sweats. He does not report any shortness of breath or cough. Does not report any adenopathy or rashes. He did not report any chest pain, palpitation orthopnea. He does not report any frequency, urgency or hematuria. Rest of his review of systems unremarkable.  Medications: I have reviewed the patient's current medications.  Current Outpatient Prescriptions  Medication Sig Dispense Refill  . aspirin 81 MG tablet Take 81 mg by mouth daily.    . cetirizine (ZYRTEC) 10 MG tablet Take 10 mg by mouth daily.    Marland Kitchen ibuprofen (ADVIL,MOTRIN) 200 MG tablet Take 200 mg by mouth every 6 (six) hours as needed for pain.    Marland Kitchen omeprazole (PRILOSEC) 40 MG capsule Take 40 mg by mouth daily.    . ranitidine (ZANTAC) 150 MG  tablet Take 150 mg by mouth 2 (two) times daily.     No current facility-administered medications for this visit.     Allergies:  Allergies  Allergen Reactions  . Bee Venom Anaphylaxis    Past Medical History, Surgical history, Social history, and Family History were reviewed and updated.   Physical Exam: Blood pressure 134/81, pulse 82, temperature 98.2 F (36.8 C), temperature source Oral, resp. rate 18, height 6\' 1"  (1.854 m), weight 219 lb 9.6 oz (99.61 kg), SpO2 100 %. ECOG: 0 General appearance: Healthy appearing gentleman without distress. Head: Normocephalic, without obvious abnormality. No oral ulcers or lesions. Neck: no adenopathy Lymph nodes: Cervical, supraclavicular, and axillary nodes normal. Heart:regular rate and rhythm, S1, S2 normal, no murmur, click, rub or gallop Lung:chest clear, no wheezing, rales, normal symmetric air entry Abdomin: soft, non-tender, without masses or organomegaly EXT:no erythema, induration, or nodules   Lab Results: Lab Results  Component Value Date   WBC 4.1 10/07/2015   HGB 15.4 10/07/2015   HCT 45.3 10/07/2015   MCV 89.2 10/07/2015   PLT 248 10/07/2015     Chemistry      Component Value Date/Time   NA 141 10/07/2015 0904   NA 140 10/03/2007 1430   K 4.1 10/07/2015 0904   K 3.2* 10/03/2007 1430   CL 105 02/19/2013 1457   CL 103 10/03/2007 1430   CO2 25 10/07/2015 0904   CO2 25 10/03/2007 1430   BUN 16.1 10/07/2015 0904   BUN 5* 10/03/2007 1430  CREATININE 1.0 10/07/2015 0904   CREATININE 0.58 10/03/2007 1430      Component Value Date/Time   CALCIUM 9.5 10/07/2015 0904   CALCIUM 9.6 10/03/2007 1430   ALKPHOS 74 10/07/2015 0904   ALKPHOS 95 10/03/2007 1430   AST 16 10/07/2015 0904   AST 16 10/03/2007 1430   ALT 29 10/07/2015 0904   ALT 17 10/03/2007 1430   BILITOT 0.54 10/07/2015 0904   BILITOT 0.9 10/03/2007 1430      Results for LORRY, IZQUIERDO (MRN [id number]) as of 10/12/2015 13:16  Ref. Range 04/13/2015  13:55 10/07/2015 09:04  AFP Tumor Marker Latest Ref Range: <6.1 ng/mL 3.4 3.7  Beta hCG, Tumor Marker Latest Ref Range: < 5.0 mIU/mL < 2.0     EXAM: CT CHEST, ABDOMEN, AND PELVIS WITH CONTRAST  TECHNIQUE: Multidetector CT imaging of the chest, abdomen and pelvis was performed following the standard protocol during bolus administration of intravenous contrast.  CONTRAST: 153mL OMNIPAQUE IOHEXOL 300 MG/ML SOLN  COMPARISON: 10/13/2014  FINDINGS: CT CHEST FINDINGS  Mediastinum/Lymph Nodes: No masses, pathologically enlarged lymph nodes, or other significant abnormality.  Lungs/Pleura: No pulmonary mass, infiltrate, or effusion.  Musculoskeletal: No chest wall mass or suspicious bone lesions identified.  CT ABDOMEN PELVIS FINDINGS  Hepatobiliary: Tiny sub-cm low-attenuation lesion in the inferior right hepatic lobe is stable and most likely represents a tiny benign cyst. No liver masses are identified. Gallbladder is unremarkable.  Pancreas: No mass, inflammatory changes, or other significant abnormality.  Spleen: Within normal limits in size and appearance.  Adrenals/Urinary Tract: No masses identified. No evidence of hydronephrosis.  Stomach/Bowel: No evidence of obstruction, inflammatory process, or abnormal fluid collections.  Vascular/Lymphatic: No pathologically enlarged lymph nodes. No evidence of abdominal aortic aneursym.  Reproductive: No mass or other significant abnormality.  Other: None.  Musculoskeletal: No suspicious bone lesions identified.  IMPRESSION: Stable exam. No evidence of metastatic disease within the chest, abdomen, or pelvis.      40 year old gentleman with the following issues:  1. 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 He is status post definitive treatment with radiation therapy completed in June of 2014. Follow-up CT scan and tumor markers have been within normal  range.  Laboratory data as well as CT scan on 10/07/2015 were reviewed today and showed no evidence of recurrent disease. The plan is to continue with active surveillance with a clinical visit every 6 months, laboratory testing every 6 months and a CT scan every 12 months. This schedule will be followed till he is 5 years out from his treatments. He will be followed annually after that.  2. Followup: Will be in 6 months.    [facility], MD 2/14/20171:27 PM

## 2015-10-13 LAB — ALPHA FETO PROTEIN (PARALLEL TESTING): AFP TUMOR MARKER: 3.7 ng/mL (ref <6.1)

## 2015-10-13 LAB — BETA HCG, QN (PARALLEL TESTING): Beta hCG, Tumor Marker: <2 m[IU]/mL (ref <5.0)

## 2015-10-14 ENCOUNTER — Ambulatory Visit: Payer: BLUE CROSS/BLUE SHIELD | Admitting: Oncology

## 2016-04-10 ENCOUNTER — Telehealth: Payer: Self-pay | Admitting: *Deleted

## 2016-04-10 NOTE — Telephone Encounter (Signed)
Patient called and left message to cancel and /s his lab/ct scan appt for tomorrow. I have forwarded the message to the desk RN.

## 2016-04-11 ENCOUNTER — Other Ambulatory Visit: Payer: BLUE CROSS/BLUE SHIELD

## 2016-04-11 ENCOUNTER — Ambulatory Visit: Payer: BLUE CROSS/BLUE SHIELD | Admitting: Oncology

## 2016-04-15 ENCOUNTER — Telehealth: Payer: Self-pay | Admitting: Oncology

## 2016-04-15 NOTE — Telephone Encounter (Signed)
Returned pt's call about rescheduling 8/15 appts. There was no answer and no vm. No appts made.

## 2016-04-19 ENCOUNTER — Telehealth: Payer: Self-pay | Admitting: Oncology

## 2016-04-19 NOTE — Telephone Encounter (Signed)
PATIENT CALLED TO [facility] MISSED APPT. 04/19/16

## 2016-05-18 ENCOUNTER — Ambulatory Visit: Payer: BLUE CROSS/BLUE SHIELD | Admitting: Oncology

## 2016-05-18 ENCOUNTER — Other Ambulatory Visit: Payer: BLUE CROSS/BLUE SHIELD

## 2016-05-23 ENCOUNTER — Ambulatory Visit (HOSPITAL_BASED_OUTPATIENT_CLINIC_OR_DEPARTMENT_OTHER): Payer: BLUE CROSS/BLUE SHIELD | Admitting: Oncology

## 2016-05-23 ENCOUNTER — Other Ambulatory Visit (HOSPITAL_BASED_OUTPATIENT_CLINIC_OR_DEPARTMENT_OTHER): Payer: BLUE CROSS/BLUE SHIELD

## 2016-05-23 ENCOUNTER — Telehealth: Payer: Self-pay | Admitting: Oncology

## 2016-05-23 VITALS — BP 125/78 | HR 73 | Temp 97.8°F | Resp 18 | Ht 73.0 in | Wt 226.7 lb

## 2016-05-23 DIAGNOSIS — Z8547 Personal history of malignant neoplasm of testis: Secondary | ICD-10-CM

## 2016-05-23 DIAGNOSIS — C6290 Malignant neoplasm of unspecified testis, unspecified whether descended or undescended: Secondary | ICD-10-CM

## 2016-05-23 LAB — CBC WITH DIFFERENTIAL/PLATELET
BASO%: 0.6 % (ref 0.0–2.0)
Basophils Absolute: 0 10*3/uL (ref 0.0–0.1)
EOS%: 3.5 % (ref 0.0–7.0)
Eosinophils Absolute: 0.2 10*3/uL (ref 0.0–0.5)
HCT: 45.8 % (ref 38.4–49.9)
HEMOGLOBIN: 15.7 g/dL (ref 13.0–17.1)
LYMPH#: 1.8 10*3/uL (ref 0.9–3.3)
LYMPH%: 36.1 % (ref 14.0–49.0)
MCH: 30.7 pg (ref 27.2–33.4)
MCHC: 34.3 g/dL (ref 32.0–36.0)
MCV: 89.5 fL (ref 79.3–98.0)
MONO#: 0.3 10*3/uL (ref 0.1–0.9)
MONO%: 6.9 % (ref 0.0–14.0)
NEUT%: 52.9 % (ref 39.0–75.0)
NEUTROS ABS: 2.6 10*3/uL (ref 1.5–6.5)
Platelets: 252 10*3/uL (ref 140–400)
RBC: 5.12 10*6/uL (ref 4.20–5.82)
RDW: 12.7 % (ref 11.0–14.6)
WBC: 4.9 10*3/uL (ref 4.0–10.3)

## 2016-05-23 LAB — COMPREHENSIVE METABOLIC PANEL
ALBUMIN: 4.1 g/dL (ref 3.5–5.0)
ALK PHOS: 85 U/L (ref 40–150)
ALT: 40 U/L (ref 0–55)
AST: 20 U/L (ref 5–34)
Anion Gap: 9 mEq/L (ref 3–11)
BUN: 20 mg/dL (ref 7.0–26.0)
CO2: 25 mEq/L (ref 22–29)
Calcium: 10 mg/dL (ref 8.4–10.4)
Chloride: 106 mEq/L (ref 98–109)
Creatinine: 1.1 mg/dL (ref 0.7–1.3)
EGFR: 86 mL/min/{1.73_m2} — ABNORMAL LOW (ref >90)
GLUCOSE: 96 mg/dL (ref 70–140)
POTASSIUM: 4.2 meq/L (ref 3.5–5.1)
SODIUM: 140 meq/L (ref 136–145)
TOTAL PROTEIN: 7.6 g/dL (ref 6.4–8.3)
Total Bilirubin: 0.78 mg/dL (ref 0.20–1.20)

## 2016-05-23 LAB — LACTATE DEHYDROGENASE: LDH: 145 U/L (ref 125–245)

## 2016-05-23 NOTE — Telephone Encounter (Signed)
Gave patient avs report and appointments for for March. [facility] will call re scan.

## 2016-05-23 NOTE — Progress Notes (Signed)
Hematology and Oncology Follow Up Visit  Clifford Davis [id number] 1975-12-26 40 y.o. 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 He presented with a left testicular mass and a CT scan showed 3.1 x 2.2 retroperitoneal lymph node indicating stage IIB disease.   Prior Therapy: He is status post radiation therapy for a total of 35 gray to the para-aortic and pelvic lymph node completed around 02/13/2013.  Current therapy: Observation and surveillance.  Interim History:  Mr. Clifford Davis returns today for a followup visit by him self. Since his last visit, he reports no changes in his health. He does not report any abdominal pain, early satiety, lymphadenopathy or hematuria. He denied any dysphagia or neck pain. He continues to work full-time without any decline in his performance status.  He does not report any testicular pain or penile discharge. He reports that he is expecting a child with his wife in November 2017.   He does not report any headaches or blurry vision, syncope or seizures. He does not report any fevers or chills or sweats. He does not report any shortness of breath or cough. Does not report any adenopathy or rashes. He did not report any chest pain, palpitation orthopnea. He does not report any frequency, urgency or hematuria. Rest of his review of systems unremarkable.  Medications: I have reviewed the patient's current medications.  Current Outpatient Prescriptions  Medication Sig Dispense Refill  . aspirin 81 MG tablet Take 81 mg by mouth daily.    . cetirizine (ZYRTEC) 10 MG tablet Take 10 mg by mouth daily.    Marland Kitchen ibuprofen (ADVIL,MOTRIN) 200 MG tablet Take 200 mg by mouth every 6 (six) hours as needed for pain.    Marland Kitchen omeprazole (PRILOSEC) 40 MG capsule Take 40 mg by mouth daily.    . ranitidine (ZANTAC) 150 MG tablet Take 150 mg by mouth 2 (two) times daily.     No current  facility-administered medications for this visit.      Allergies:  Allergies  Allergen Reactions  . Bee Venom Anaphylaxis    Past Medical History, Surgical history, Social history, and Family History were reviewed and updated.   Physical Exam: Blood pressure 125/78, pulse 73, temperature 97.8 F (36.6 C), temperature source Oral, resp. rate 18, height 6\' 1"  (1.854 m), weight 226 lb 11.2 oz (102.8 kg), SpO2 98 %. ECOG: 0 General appearance: Well-appearing gentleman without distress. Head: Normocephalic, without obvious abnormality. No oral thrush noted. Neck: no adenopathy Lymph nodes: Cervical, supraclavicular, and axillary nodes normal. Heart:regular rate and rhythm, S1, S2 normal, no murmur, click, rub or gallop Lung:chest clear, no wheezing, rales, normal symmetric air entry Abdomin: soft, non-tender, without masses or organomegaly no shifting dullness or ascites. EXT:no erythema, induration, or nodules   Lab Results: Lab Results  Component Value Date   WBC 4.9 05/23/2016   HGB 15.7 05/23/2016   HCT 45.8 05/23/2016   MCV 89.5 05/23/2016   PLT 252 05/23/2016     Chemistry      Component Value Date/Time   NA 141 10/07/2015 0904   K 4.1 10/07/2015 0904   CL 105 02/19/2013 1457   CO2 25 10/07/2015 0904   BUN 16.1 10/07/2015 0904   CREATININE 1.0 10/07/2015 0904      Component Value Date/Time   CALCIUM 9.5 10/07/2015 0904   ALKPHOS 74 10/07/2015 0904   AST 16 10/07/2015 0904   ALT 29 10/07/2015  [id number]   BILITOT 0.54 10/07/2015 0904      Results for Clifford Davis, Clifford Davis (MRN [id number]) as of 10/12/2015 13:16  Ref. Range 04/13/2015 13:55 10/07/2015 09:04  AFP Tumor Marker Latest Ref Range: <6.1 ng/mL 3.4 3.7  Beta hCG, Tumor Marker Latest Ref Range: < 5.0 mIU/mL < 71.78      40 year old gentleman with the following issues:  1. 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 He is status post definitive treatment with radiation therapy completed in  June of 2014. He remained in remission without any evidence of recurrent disease.  Laboratory data as well as CT scan on 10/07/2015 showed no evidence of recurrent disease. The plan is to continue with active surveillance with a clinical visit every 6 months, laboratory testing every 6 months and a CT scan every 12 months.   His next scan will be in March 2018 and his last scan will be scheduled in March 2019. If no cancer recurrence noted at that time he will no longer need imaging studies a clinical visit on an annual basis.  2. Fertility issues: Appears to have no fertility issues and is expecting a child in the next month.  3. Followup: Will be in 6 months.    [facility], MD 9/26/20179:18 AM

## 2016-05-24 LAB — AFP TUMOR MARKER: AFP, Serum, Tumor Marker: 5.5 ng/mL (ref 0.0–8.3)

## 2016-05-24 LAB — BETA HCG QUANT (REF LAB)

## 2016-05-26 LAB — BETA HCG, QN (PARALLEL TESTING): Beta hCG, Tumor Marker: <2 m[IU]/mL (ref <5.0)

## 2016-05-26 LAB — ALPHA FETO PROTEIN (PARALLEL TESTING): AFP TUMOR MARKER: 4.2 ng/mL (ref <6.1)

## 2016-11-20 ENCOUNTER — Other Ambulatory Visit (HOSPITAL_BASED_OUTPATIENT_CLINIC_OR_DEPARTMENT_OTHER): Payer: BLUE CROSS/BLUE SHIELD

## 2016-11-20 DIAGNOSIS — C6290 Malignant neoplasm of unspecified testis, unspecified whether descended or undescended: Secondary | ICD-10-CM | POA: Diagnosis not present

## 2016-11-20 DIAGNOSIS — Z8547 Personal history of malignant neoplasm of testis: Secondary | ICD-10-CM

## 2016-11-20 LAB — COMPREHENSIVE METABOLIC PANEL
ALT: 43 U/L (ref 0–55)
AST: 17 U/L (ref 5–34)
Albumin: 4.2 g/dL (ref 3.5–5.0)
Alkaline Phosphatase: 70 U/L (ref 40–150)
Anion Gap: 8 mEq/L (ref 3–11)
BUN: 16.7 mg/dL (ref 7.0–26.0)
CALCIUM: 9.8 mg/dL (ref 8.4–10.4)
CHLORIDE: 108 meq/L (ref 98–109)
CO2: 25 mEq/L (ref 22–29)
Creatinine: 1 mg/dL (ref 0.7–1.3)
Glucose: 102 mg/dl (ref 70–140)
POTASSIUM: 4 meq/L (ref 3.5–5.1)
Sodium: 141 mEq/L (ref 136–145)
Total Bilirubin: 0.56 mg/dL (ref 0.20–1.20)
Total Protein: 6.9 g/dL (ref 6.4–8.3)

## 2016-11-20 LAB — CBC WITH DIFFERENTIAL/PLATELET
BASO%: 0.9 % (ref 0.0–2.0)
BASOS ABS: 0.1 10*3/uL (ref 0.0–0.1)
EOS%: 5.6 % (ref 0.0–7.0)
Eosinophils Absolute: 0.4 10*3/uL (ref 0.0–0.5)
HEMATOCRIT: 46.2 % (ref 38.4–49.9)
HGB: 15.7 g/dL (ref 13.0–17.1)
LYMPH#: 2.3 10*3/uL (ref 0.9–3.3)
LYMPH%: 35.4 % (ref 14.0–49.0)
MCH: 30.2 pg (ref 27.2–33.4)
MCHC: 34.1 g/dL (ref 32.0–36.0)
MCV: 88.7 fL (ref 79.3–98.0)
MONO#: 0.4 10*3/uL (ref 0.1–0.9)
MONO%: 5.7 % (ref 0.0–14.0)
NEUT#: 3.4 10*3/uL (ref 1.5–6.5)
NEUT%: 52.4 % (ref 39.0–75.0)
Platelets: 261 10*3/uL (ref 140–400)
RBC: 5.2 10*6/uL (ref 4.20–5.82)
RDW: 13.2 % (ref 11.0–14.6)
WBC: 6.4 10*3/uL (ref 4.0–10.3)

## 2016-11-20 LAB — LACTATE DEHYDROGENASE: LDH: 122 U/L — ABNORMAL LOW (ref 125–245)

## 2016-11-21 LAB — AFP TUMOR MARKER: AFP, Serum, Tumor Marker: 3.8 ng/mL (ref 0.0–8.3)

## 2016-11-21 LAB — BETA HCG QUANT (REF LAB): hCG Quant: <1 m[IU]/mL (ref 0–3)

## 2016-11-24 ENCOUNTER — Ambulatory Visit (HOSPITAL_BASED_OUTPATIENT_CLINIC_OR_DEPARTMENT_OTHER): Payer: BLUE CROSS/BLUE SHIELD | Admitting: Oncology

## 2016-11-24 ENCOUNTER — Telehealth: Payer: Self-pay | Admitting: Oncology

## 2016-11-24 VITALS — BP 122/76 | HR 78 | Temp 98.1°F | Resp 18 | Ht 73.0 in | Wt 229.1 lb

## 2016-11-24 DIAGNOSIS — Z8547 Personal history of malignant neoplasm of testis: Secondary | ICD-10-CM | POA: Diagnosis not present

## 2016-11-24 DIAGNOSIS — C6290 Malignant neoplasm of unspecified testis, unspecified whether descended or undescended: Secondary | ICD-10-CM

## 2016-11-24 NOTE — Telephone Encounter (Signed)
Gave patient AVS and calender per 11/24/2016 los. [facility] to contact patient.

## 2016-11-24 NOTE — Progress Notes (Signed)
Hematology and Oncology Follow Up Visit  Clifford Davis [id number] Oct 05, 1975 41 y.o. 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 He presented with a left testicular mass and a CT scan showed 3.1 x 2.2 retroperitoneal lymph node indicating stage IIB disease.   Prior Therapy: He is status post radiation therapy for a total of 35 gray to the para-aortic and pelvic lymph node completed around 02/13/2013.  Current therapy: Observation and surveillance.  Interim History:  Clifford Davis returns today for a followup visit by him self. Since his last visit, he reports doing well without any recent complaints. He remains active and attends to activities of daily living. He does not report any abdominal pain, early satiety, lymphadenopathy or hematuria. He denied any dysphagia or neck pain. He does report some sinus congestion as well as seasonal allergies which is not unusual for him.   He does not report any headaches or blurry vision, syncope or seizures. He does not report any fevers or chills or sweats. He does not report any shortness of breath or cough. Does not report any adenopathy or rashes. He did not report any chest pain, palpitation orthopnea. He does not report any frequency, urgency or hematuria. Rest of his review of systems unremarkable.  Medications: I have reviewed the patient's current medications.  Current Outpatient Prescriptions  Medication Sig Dispense Refill  . aspirin 81 MG tablet Take 81 mg by mouth daily.    . cetirizine (ZYRTEC) 10 MG tablet Take 10 mg by mouth daily.    Marland Kitchen ibuprofen (ADVIL,MOTRIN) 200 MG tablet Take 200 mg by mouth every 6 (six) hours as needed for pain.    Marland Kitchen omeprazole (PRILOSEC) 40 MG capsule Take 40 mg by mouth daily.    . ranitidine (ZANTAC) 150 MG tablet Take 150 mg by mouth 2 (two) times daily.     No current facility-administered medications for this  visit.      Allergies:  Allergies  Allergen Reactions  . Bee Venom Anaphylaxis    Past Medical History, Surgical history, Social history, and Family History were reviewed and updated.   Physical Exam: Blood pressure 122/76, pulse 78, temperature 98.1 F (36.7 C), temperature source Oral, resp. rate 18, height 6\' 1"  (1.854 m), weight 229 lb 1.6 oz (103.9 kg), SpO2 99 %. ECOG: 0 General appearance: Well-appearing gentleman appeared comfortable without distress. Head: Normocephalic, without obvious abnormality. No oral ulcers or lesions. Neck: no adenopathy Lymph nodes: Cervical, supraclavicular, and axillary nodes normal. Heart:regular rate and rhythm, S1, S2 normal, no murmur, click, rub or gallop Lung:chest clear, no wheezing, rales, normal symmetric air entry Abdomin: soft, non-tender, without masses or organomegaly no rebound or guarding. EXT:no erythema, induration, or nodules   Lab Results: Lab Results  Component Value Date   WBC 6.4 11/20/2016   HGB 15.7 11/20/2016   HCT 46.2 11/20/2016   MCV 88.7 11/20/2016   PLT 261 11/20/2016     Chemistry      Component Value Date/Time   NA 141 11/20/2016 0853   K 4.0 11/20/2016 0853   CL 105 02/19/2013 1457   CO2 25 11/20/2016 0853   BUN 16.7 11/20/2016 0853   CREATININE 1.0 11/20/2016 0853      Component Value Date/Time   CALCIUM 9.8 11/20/2016 0853   ALKPHOS 70 11/20/2016 0853   AST 17 11/20/2016 0853   ALT 43 11/20/2016 0853   BILITOT 0.56 11/20/2016 0853  Results for Clifford Davis, Clifford Davis (MRN [id number]) as of 11/24/2016 08:56  Ref. Range 05/23/2016 08:56 11/20/2016 08:53  AFP Tumor Marker Latest Ref Range: <6.1 ng/mL 4.2   AFP, Serum, Tumor Marker Latest Ref Range: 0.0 - 8.3 ng/mL 5.5 3.8  Beta hCG, Tumor Marker Latest Ref Range: <5.0 mIU/mL < 27.74       41 year old gentleman with the following issues:  1. 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 He is status post definitive  treatment with radiation therapy completed in June of 2014. He remained in remission without any evidence of recurrent disease.  Laboratory data from 11/20/2016 showed no evidence of recurrent disease. His CT scan was not done and will be rescheduled total next week. I'll communicate these results to him via phone.  His CT scan showed no evidence of recurrent disease, his follow-up will be continued on annual basis with physical examination and laboratory testing. CT scan will be done on as needed basis.  2. Age-appropriate screening: He is up-to-date at this time.  3. Followup: Will be in 12 months.    [facility], MD 3/30/20188:55 AM

## 2016-12-07 ENCOUNTER — Ambulatory Visit (HOSPITAL_COMMUNITY)
Admission: RE | Admit: 2016-12-07 | Discharge: 2016-12-07 | Disposition: A | Discharge status: Home / Self Care | Payer: BLUE CROSS/BLUE SHIELD | Source: Ambulatory Visit | Attending: Oncology | Admitting: Oncology

## 2016-12-07 DIAGNOSIS — C6290 Malignant neoplasm of unspecified testis, unspecified whether descended or undescended: Secondary | ICD-10-CM | POA: Insufficient documentation

## 2016-12-07 DIAGNOSIS — Z9079 Acquired absence of other genital organ(s): Secondary | ICD-10-CM | POA: Insufficient documentation

## 2016-12-07 DIAGNOSIS — R911 Solitary pulmonary nodule: Secondary | ICD-10-CM | POA: Diagnosis not present

## 2016-12-07 DIAGNOSIS — K760 Fatty (change of) liver, not elsewhere classified: Secondary | ICD-10-CM | POA: Diagnosis not present

## 2016-12-07 DIAGNOSIS — R160 Hepatomegaly, not elsewhere classified: Secondary | ICD-10-CM | POA: Diagnosis not present

## 2016-12-07 MED ORDER — IOPAMIDOL (ISOVUE-300) INJECTION 61%
INTRAVENOUS | Status: AC
  Administered 2016-12-07: 100 mL
  Filled 2016-12-07: qty 100

## 2016-12-08 ENCOUNTER — Telehealth: Payer: Self-pay | Admitting: *Deleted

## 2016-12-08 NOTE — Telephone Encounter (Signed)
spoke with patient, gave results of last CT scan. normal

## 2016-12-08 NOTE — Telephone Encounter (Signed)
-----   Message from [name], MD sent at 12/07/2016  2:52 PM EDT ----- Please let him know his scan is normal.

## 2017-06-26 DIAGNOSIS — F900 Attention-deficit hyperactivity disorder, predominantly inattentive type: Secondary | ICD-10-CM | POA: Diagnosis not present

## 2017-06-26 DIAGNOSIS — F411 Generalized anxiety disorder: Secondary | ICD-10-CM | POA: Diagnosis not present

## 2017-07-24 DIAGNOSIS — F411 Generalized anxiety disorder: Secondary | ICD-10-CM | POA: Diagnosis not present

## 2017-09-11 DIAGNOSIS — F411 Generalized anxiety disorder: Secondary | ICD-10-CM | POA: Diagnosis not present

## 2017-11-19 ENCOUNTER — Telehealth: Payer: Self-pay | Admitting: Oncology

## 2017-11-19 NOTE — Telephone Encounter (Signed)
Patient called to reschedule  °

## 2017-11-23 ENCOUNTER — Ambulatory Visit: Payer: BLUE CROSS/BLUE SHIELD | Admitting: Oncology

## 2017-11-23 ENCOUNTER — Other Ambulatory Visit: Payer: BLUE CROSS/BLUE SHIELD

## 2017-12-11 DIAGNOSIS — F411 Generalized anxiety disorder: Secondary | ICD-10-CM | POA: Diagnosis not present

## 2017-12-11 DIAGNOSIS — F900 Attention-deficit hyperactivity disorder, predominantly inattentive type: Secondary | ICD-10-CM | POA: Diagnosis not present

## 2017-12-14 ENCOUNTER — Telehealth: Payer: Self-pay | Admitting: Oncology

## 2017-12-14 NOTE — Telephone Encounter (Signed)
Patient called to reschedule  °

## 2017-12-18 ENCOUNTER — Other Ambulatory Visit: Payer: BLUE CROSS/BLUE SHIELD

## 2017-12-18 ENCOUNTER — Ambulatory Visit: Payer: BLUE CROSS/BLUE SHIELD | Admitting: Oncology

## 2018-01-11 ENCOUNTER — Inpatient Hospital Stay: Payer: BLUE CROSS/BLUE SHIELD | Attending: Oncology

## 2018-01-11 ENCOUNTER — Inpatient Hospital Stay (HOSPITAL_BASED_OUTPATIENT_CLINIC_OR_DEPARTMENT_OTHER): Payer: BLUE CROSS/BLUE SHIELD | Admitting: Oncology

## 2018-01-11 ENCOUNTER — Telehealth: Payer: Self-pay | Admitting: Oncology

## 2018-01-11 VITALS — BP 130/85 | HR 60 | Temp 98.6°F | Resp 17 | Ht 73.0 in | Wt 220.9 lb

## 2018-01-11 DIAGNOSIS — Z923 Personal history of irradiation: Secondary | ICD-10-CM | POA: Diagnosis not present

## 2018-01-11 DIAGNOSIS — Z79899 Other long term (current) drug therapy: Secondary | ICD-10-CM | POA: Diagnosis not present

## 2018-01-11 DIAGNOSIS — Z8547 Personal history of malignant neoplasm of testis: Secondary | ICD-10-CM

## 2018-01-11 DIAGNOSIS — Z7982 Long term (current) use of aspirin: Secondary | ICD-10-CM | POA: Insufficient documentation

## 2018-01-11 DIAGNOSIS — C6290 Malignant neoplasm of unspecified testis, unspecified whether descended or undescended: Secondary | ICD-10-CM

## 2018-01-11 LAB — CBC WITH DIFFERENTIAL/PLATELET
BASOS ABS: 0 10*3/uL (ref 0.0–0.1)
BASOS PCT: 1 %
Eosinophils Absolute: 0.2 10*3/uL (ref 0.0–0.5)
Eosinophils Relative: 3 %
HEMATOCRIT: 45.6 % (ref 38.4–49.9)
Hemoglobin: 15.4 g/dL (ref 13.0–17.1)
Lymphocytes Relative: 35 %
Lymphs Abs: 1.7 10*3/uL (ref 0.9–3.3)
MCH: 29.9 pg (ref 27.2–33.4)
MCHC: 33.7 g/dL (ref 32.0–36.0)
MCV: 88.7 fL (ref 79.3–98.0)
Monocytes Absolute: 0.3 10*3/uL (ref 0.1–0.9)
Monocytes Relative: 7 %
NEUTROS ABS: 2.7 10*3/uL (ref 1.5–6.5)
NEUTROS PCT: 54 %
Platelets: 247 10*3/uL (ref 140–400)
RBC: 5.15 MIL/uL (ref 4.20–5.82)
RDW: 13.8 % (ref 11.0–14.6)
WBC: 5 10*3/uL (ref 4.0–10.3)

## 2018-01-11 LAB — COMPREHENSIVE METABOLIC PANEL
ALK PHOS: 75 U/L (ref 40–150)
ALT: 47 U/L (ref 0–55)
AST: 23 U/L (ref 5–34)
Albumin: 4.4 g/dL (ref 3.5–5.0)
Anion gap: 9 (ref 3–11)
BUN: 15 mg/dL (ref 7–26)
CALCIUM: 9.5 mg/dL (ref 8.4–10.4)
CO2: 25 mmol/L (ref 22–29)
CREATININE: 0.9 mg/dL (ref 0.70–1.30)
Chloride: 106 mmol/L (ref 98–109)
GFR calc non Af Amer: >60 mL/min (ref >60)
GLUCOSE: 102 mg/dL (ref 70–140)
Potassium: 3.9 mmol/L (ref 3.5–5.1)
SODIUM: 140 mmol/L (ref 136–145)
Total Bilirubin: 0.4 mg/dL (ref 0.2–1.2)
Total Protein: 7.3 g/dL (ref 6.4–8.3)

## 2018-01-11 LAB — LACTATE DEHYDROGENASE: LDH: 147 U/L (ref 125–245)

## 2018-01-11 NOTE — Progress Notes (Signed)
Hematology and Oncology Follow Up Visit  Clifford Davis [id number] 1975-12-13 42 y.o. 01/11/2018 8:19 AM   Principle Diagnosis: 42 year old with stage IIB pure seminoma of the left testicle diagnosed in April of 2014.   Prior Therapy: He is status post radiation therapy for a total of 35 gray to the para-aortic and pelvic lymph node completed around 02/13/2013.  Current therapy: Active surveillance.  Interim History:  Clifford Davis returns today for a follow-up visit.  Since last visit, he reports no changes in his health.  He continues to attend to activities of daily living including work-related duties and taking care of his 85-month old son.  He denies any penile discharge or urination difficulties.  He denies any masses or lesions.  He any changes in his bowel habits.  He denies any hospitalizations or illnesses.  He does not report any headaches or blurry vision, syncope or seizures. He does not report any fevers or chills or sweats.  His appetite and weight remain stable.  He denies any chest pain, palpitation orthopnea or leg edema.  He does not report any shortness of breath or cough.  He denies any nausea, vomiting or abdominal pain.  He does not report any frequency, urgency or hematuria.  He denies any lymphadenopathy or petechiae.  He denies any skin rashes or lesions.  Rest of his review of systems is negative.  Medications: I have reviewed the patient's current medications.  Current Outpatient Medications  Medication Sig Dispense Refill  . aspirin 81 MG tablet Take 81 mg by mouth daily.    . cetirizine (ZYRTEC) 10 MG tablet Take 10 mg by mouth daily.    Marland Kitchen ibuprofen (ADVIL,MOTRIN) 200 MG tablet Take 200 mg by mouth every 6 (six) hours as needed for pain.    Marland Kitchen omeprazole (PRILOSEC) 40 MG capsule Take 40 mg by mouth daily.    . ranitidine (ZANTAC) 150 MG tablet Take 150 mg by mouth 2 (two) times daily.     No current facility-administered medications for this visit.       Allergies:  Allergies  Allergen Reactions  . Bee Venom Anaphylaxis    Past Medical History, Surgical history, Social history, and Family History were reviewed and updated.   Physical Exam: Blood pressure 130/85, pulse 60, temperature 98.6 F (37 C), temperature source Oral, resp. rate 17, height 6\' 1"  (1.854 m), weight 220 lb 14.4 oz (100.2 kg), SpO2 98 %.   ECOG: 0 General appearance: Alert, awake gentleman without distress. Head: Atraumatic without abnormalities. Oropharynx: Without any thrush or ulcers. Eyes: No scleral icterus. Lymph nodes: No lymphadenopathy noted in the cervical, supraclavicular, and axillary nodes  Heart:regular rate and rhythm, without any murmurs. Lung: Clear to auscultation without any rhonchi, wheezes or dullness to percussion. Abdomin: Soft, nontender without any rebound or guarding. Musculoskeletal: No joint deformity or effusion. Neurological: No deficits noted.   Lab Results: Lab Results  Component Value Date   WBC 6.4 11/20/2016   HGB 15.7 11/20/2016   HCT 46.2 11/20/2016   MCV 88.7 11/20/2016   PLT 261 11/20/2016     Chemistry      Component Value Date/Time   NA 141 11/20/2016 0853   K 4.0 11/20/2016 0853   CL 105 02/19/2013 1457   CO2 25 11/20/2016 0853   BUN 16.7 11/20/2016 0853   CREATININE 1.0 11/20/2016 0853      Component Value Date/Time   CALCIUM 9.8 11/20/2016 0853   ALKPHOS 70 11/20/2016 0853   AST 17  11/20/2016 0853   ALT 43 11/20/2016 0853   BILITOT 0.56 11/20/2016 0853      Results for MARBIN, OLSHEFSKI (MRN [id number]) as of 11/24/2016 08:56  Ref. Range 05/23/2016 08:56 11/20/2016 08:53  AFP Tumor Marker Latest Ref Range: <6.1 ng/mL 4.2   AFP, Serum, Tumor Marker Latest Ref Range: 0.0 - 8.3 ng/mL 5.5 3.8  Beta hCG, Tumor Marker Latest Ref Range: <5.0 mIU/mL < 55.29       42 year old gentleman with the following issues:  1. Stage IIB pure seminoma diagnosed in April of 2014 of the left testicle. He is  status post radiation therapy completed in June of 2014.  He remains in remission since that time.  Imaging studies, laboratory data and physical examination since that time is been within normal range.  His exam today and laboratory data do not suggest any recurrent disease.  The natural course of this disease as well as recurrence pattern was discussed today with the patient as well as plan of active surveillance moving forward.  The plan is to continue with active surveillance on annual basis and repeat imaging studies as needed.    2. Age-appropriate health maintenance: We continue to emphasize the importance of following up with a primary care provider for age-appropriate healthcare issues.  3. Followup: Will be in 1 year.  15  minutes was spent with the patient face-to-face today.  More than 50% of time was dedicated to patient counseling, education and coordination of his care.    Zola Button, MD 5/17/20198:19 AM

## 2018-01-11 NOTE — Telephone Encounter (Signed)
Scheduled appt per 5/17 los - Gave patient aVS and calender per los. Lab and f./u in one year.

## 2018-01-12 LAB — BETA HCG QUANT (REF LAB)

## 2018-01-12 LAB — AFP TUMOR MARKER: AFP, Serum, Tumor Marker: 3.7 ng/mL (ref 0.0–8.3)

## 2018-06-21 ENCOUNTER — Ambulatory Visit: Payer: Self-pay | Admitting: Family Medicine

## 2018-07-10 ENCOUNTER — Encounter: Payer: Self-pay | Admitting: Family Medicine

## 2018-07-10 ENCOUNTER — Ambulatory Visit (INDEPENDENT_AMBULATORY_CARE_PROVIDER_SITE_OTHER): Payer: BLUE CROSS/BLUE SHIELD | Admitting: Family Medicine

## 2018-07-10 ENCOUNTER — Other Ambulatory Visit: Payer: Self-pay

## 2018-07-10 VITALS — BP 137/82 | HR 82 | Temp 98.7°F | Resp 16 | Ht 73.0 in | Wt 248.2 lb

## 2018-07-10 DIAGNOSIS — K219 Gastro-esophageal reflux disease without esophagitis: Secondary | ICD-10-CM | POA: Diagnosis not present

## 2018-07-10 DIAGNOSIS — J309 Allergic rhinitis, unspecified: Secondary | ICD-10-CM

## 2018-07-10 DIAGNOSIS — F411 Generalized anxiety disorder: Secondary | ICD-10-CM

## 2018-07-10 DIAGNOSIS — Z23 Encounter for immunization: Secondary | ICD-10-CM

## 2018-07-10 DIAGNOSIS — Z8547 Personal history of malignant neoplasm of testis: Secondary | ICD-10-CM

## 2018-07-10 NOTE — Progress Notes (Signed)
Subjective:  By signing my name below, I, Moises Blood, attest that this documentation has been prepared under the direction and in the presence of Merri Ray, MD. Electronically Signed: Moises Blood, [place]. 07/10/2018 , 4:09 PM .  Patient was seen in Room 13 .   Patient ID: Clifford Davis, male    DOB: 1976/02/15, 41 y.o.   MRN: [id number] Chief Complaint  Patient presents with  . Establish Care    no issues at this moment   HPI Clifford Davis is a 42 y.o. male Here to establish care. He has a history of testicular carcinoma with seminoma, GERD and allergic rhinitis. His oncologist is Dr. Alen Blew. He isn't fasting today.   He has a 51 year old son at home, Mallie Mussel. He also has a 28 year old step daughter. 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   History of testicular carcinoma with seminoma Patient was diagnosed testicular carcinoma with seminoma about 5 years ago in April 2014. He had radiation therapy to testicles and lymph nodes in June 2014. He had surgery done by Dr. Jasmine December at [facility]. He has been in remission since that time. Plan for active surveillance once a year, imaging as needed; last office visit in May.   GERD He takes OTC Nexium 40 mg about every day. He has symptoms if he doesn't take it every day depending on what he eats and drinks.   Anxiety He was started on Prozac 20 mg qd since the beginning of this year. This was started and prescribed by his psychiatrist.   Allergic rhinitis He takes Zyrtec prn for allergies.   Health maintenance Tetanus: he isn't sure when his last tetanus was done; updated today.  Flu shot: doesn't recall if he's ever had a flu shot; updated today.  Exercise: he was working out more previously; Body mass index is 32.75 kg/m.   Patient Active Problem List   Diagnosis Date Noted  . Hx of radiation therapy   . Testicular carcinoma ([place]) 12/26/2012  . Primary testicular seminoma ([place]) 12/26/2012    Past Medical History:  Diagnosis Date  . Allergy    seasonal allergies  . GERD (gastroesophageal reflux disease)   . Hx of radiation therapy 01/09/13-02/13/13   left pelvis, periaortic area 25 gray 20 fx, periaortic boosted to 35 gray  . Testicular carcinoma ([place]) 12/02/2012  . Testis mass    LEFT   Past Surgical History:  Procedure Laterality Date  . ORCHIECTOMY Left 12/02/2012   Procedure: ORCHIECTOMY;  Surgeon: Molli Hazard, MD;  Location: [facility];  Service: Urology;  Laterality: Left;  1HR LEFT RADICAL INGUINAL ORCHIECTOMY   . ORIF LEFT WRIST DISTAL RADIAL FX'S  10-03-2007   Allergies  Allergen Reactions  . Bee Venom Anaphylaxis   Prior to Admission medications   Medication Sig Start Date End Date Taking? Authorizing Provider  aspirin 81 MG tablet Take 81 mg by mouth daily.    [provider]  cetirizine (ZYRTEC) 10 MG tablet Take 10 mg by mouth daily.    [provider]  ibuprofen (ADVIL,MOTRIN) 200 MG tablet Take 200 mg by mouth every 6 (six) hours as needed for pain.    [provider]  omeprazole (PRILOSEC) 40 MG capsule Take 40 mg by mouth daily.    [provider]  ranitidine (ZANTAC) 150 MG tablet Take 150 mg by mouth 2 (two) times daily.    [provider]  Social History   Socioeconomic History  . Marital status: Married    Spouse name: Not on file  . Number of children: Not on file  . Years of education: Not on file  . Highest education level: Not on file  Occupational History  . Not on file  Social Needs  . Financial resource strain: Not on file  . Food insecurity:    Worry: Not on file    Inability: Not on file  . Transportation needs:    Medical: Not on file    Non-medical: Not on file  Tobacco Use  . Smoking status: Never Smoker  . Smokeless tobacco: Never Used  Substance and Sexual Activity  . Alcohol use: Yes    Alcohol/week: 3.0 standard drinks    Types: 3 Cans of beer  per week  . Drug use: No  . Sexual activity: Yes  Lifestyle  . Physical activity:    Days per week: Not on file    Minutes per session: Not on file  . Stress: Not on file  Relationships  . Social connections:    Talks on phone: Not on file    Gets together: Not on file    Attends religious service: Not on file    Active member of club or organization: Not on file    Attends meetings of clubs or organizations: Not on file    Relationship status: Not on file  . Intimate partner violence:    Fear of current or ex partner: Not on file    Emotionally abused: Not on file    Physically abused: Not on file    Forced sexual activity: Not on file  Other Topics Concern  . Not on file  Social History Narrative  . Not on file   Review of Systems  Constitutional: Negative for fatigue and unexpected weight change.  Eyes: Negative for visual disturbance.  Respiratory: Negative for cough, chest tightness and shortness of breath.   Cardiovascular: Negative for chest pain, palpitations and leg swelling.  Gastrointestinal: Negative for abdominal pain and blood in stool.  Neurological: Negative for dizziness, light-headedness and headaches.       Objective:   Physical Exam  Constitutional: He is oriented to person, place, and time. He appears well-developed and well-nourished. No distress.  HENT:  Head: Normocephalic and atraumatic.  Eyes: Pupils are equal, round, and reactive to light. EOM are normal.  Neck: Neck supple.  Cardiovascular: Normal rate.  Pulmonary/Chest: Effort normal and breath sounds normal. No respiratory distress.  Musculoskeletal: Normal range of motion.  Neurological: He is alert and oriented to person, place, and time.  Skin: Skin is warm and dry.  Psychiatric: He has a normal mood and affect. His behavior is normal.  Nursing note and vitals reviewed.   Vitals:   07/10/18 1532  BP: 137/82  Pulse: 82  Resp: 16  Temp: 98.7 F (37.1 C)  TempSrc: Oral  SpO2:  97%  Weight: 248 lb 3.2 oz (112.6 kg)  Height: 6\' 1"  (1.854 m)       Assessment & Plan:    Clifford Davis is a 42 y.o. male Anxiety state  -Stable on Prozac and followed by psychiatry.  History of testicular cancer  -Has ongoing follow-up with oncology.  Can discuss any earlier cancer screening with his oncologist to discuss at upcoming physical.  Gastroesophageal reflux disease, esophagitis presence not specified  -Stable, no med changes, except option of every other day dosing of PPI and handout given  on trigger foods.  Allergic rhinitis, unspecified seasonality, unspecified trigger  -Controlled with over-the-counter treatments.  Need for Tdap vaccination - Plan: Tdap vaccine greater than or equal to 7yo IM  Needs flu shot - Plan: Flu Vaccine QUAD 36+ mos IM   No orders of the defined types were placed in this encounter.  Patient Instructions    See info on heartburn below. Ok to try acid blocker every other day to see if that is effective.   Please schedule physical in the next 3 months if possible.  Tetanus/Tdap vaccine as well as flu shots were given today.  Thank you for coming in today.     Food Choices for Gastroesophageal Reflux Disease, Adult When you have gastroesophageal reflux disease (GERD), the foods you eat and your eating habits are very important. Choosing the right foods can help ease your discomfort. What guidelines do I need to follow?  Choose fruits, vegetables, whole grains, and low-fat dairy products.  Choose low-fat meat, fish, and poultry.  Limit fats such as oils, salad dressings, butter, nuts, and avocado.  Keep a food diary. This helps you identify foods that cause symptoms.  Avoid foods that cause symptoms. These may be different for everyone.  Eat small meals often instead of 3 large meals a day.  Eat your meals slowly, in a place where you are relaxed.  Limit fried foods.  Cook foods using methods other than frying.  Avoid  drinking alcohol.  Avoid drinking large amounts of liquids with your meals.  Avoid bending over or lying down until 2-3 hours after eating. What foods are not recommended? These are some foods and drinks that may make your symptoms worse: Vegetables Tomatoes. Tomato juice. Tomato and spaghetti sauce. Chili peppers. Onion and garlic. Horseradish. Fruits Oranges, grapefruit, and lemon (fruit and juice). Meats High-fat meats, fish, and poultry. This includes hot dogs, ribs, ham, sausage, salami, and bacon. Dairy Whole milk and chocolate milk. Sour cream. Cream. Butter. Ice cream. Cream cheese. Drinks Coffee and tea. Bubbly (carbonated) drinks or energy drinks. Condiments Hot sauce. Barbecue sauce. Sweets/Desserts Chocolate and cocoa. Donuts. Peppermint and spearmint. Fats and Oils High-fat foods. This includes [place] fries and potato chips. Other Vinegar. Strong spices. This includes black pepper, white pepper, red pepper, cayenne, curry powder, cloves, ginger, and chili powder. The items listed above may not be a complete list of foods and drinks to avoid. Contact your dietitian for more information. This information is not intended to replace advice given to you by your health care provider. Make sure you discuss any questions you have with your health care provider. Document Released: 02/13/2012 Document Revised: 01/20/2016 Document Reviewed: 06/18/2013 Elsevier Interactive Patient Education  [employer].   If you have lab work done today you will be contacted with your lab results within the next 2 weeks.  If you have not heard from [place] then please contact us. The fastest way to get your results is to register for My Chart.   IF you received an x-ray today, you will receive an invoice from [facility]. Please contact [facility] at [phone] with questions or concerns regarding your invoice.   IF you received labwork today, you will receive an invoice  from [facility]. Please contact [facility] at [phone] with questions or concerns regarding your invoice.   Our billing staff will not be able to assist you with questions regarding bills from these companies.  You will be contacted with the lab results as soon as they  are available. The fastest way to get your results is to activate your My Chart account. Instructions are located on the last page of this paperwork. If you have not heard from [place] regarding the results in 2 weeks, please contact this office.       I personally performed the services described in this documentation, which was scribed in my presence. The recorded information has been reviewed and considered for accuracy and completeness, addended by me as needed, and agree with information above.  Signed,   Merri Ray, MD [facility].  07/15/18 11:52 AM

## 2018-07-10 NOTE — Patient Instructions (Addendum)
  See info on heartburn below. Ok to try acid blocker every other day to see if that is effective.   Please schedule physical in the next 3 months if possible.  Tetanus/Tdap vaccine as well as flu shots were given today.  Thank you for coming in today.     Food Choices for Gastroesophageal Reflux Disease, Adult When you have gastroesophageal reflux disease (GERD), the foods you eat and your eating habits are very important. Choosing the right foods can help ease your discomfort. What guidelines do I need to follow?  Choose fruits, vegetables, whole grains, and low-fat dairy products.  Choose low-fat meat, fish, and poultry.  Limit fats such as oils, salad dressings, butter, nuts, and avocado.  Keep a food diary. This helps you identify foods that cause symptoms.  Avoid foods that cause symptoms. These may be different for everyone.  Eat small meals often instead of 3 large meals a day.  Eat your meals slowly, in a place where you are relaxed.  Limit fried foods.  Cook foods using methods other than frying.  Avoid drinking alcohol.  Avoid drinking large amounts of liquids with your meals.  Avoid bending over or lying down until 2-3 hours after eating. What foods are not recommended? These are some foods and drinks that may make your symptoms worse: Vegetables Tomatoes. Tomato juice. Tomato and spaghetti sauce. Chili peppers. Onion and garlic. Horseradish. Fruits Oranges, grapefruit, and lemon (fruit and juice). Meats High-fat meats, fish, and poultry. This includes hot dogs, ribs, ham, sausage, salami, and bacon. Dairy Whole milk and chocolate milk. Sour cream. Cream. Butter. Ice cream. Cream cheese. Drinks Coffee and tea. Bubbly (carbonated) drinks or energy drinks. Condiments Hot sauce. Barbecue sauce. Sweets/Desserts Chocolate and cocoa. Donuts. Peppermint and spearmint. Fats and Oils High-fat foods. This includes [place] fries and potato chips. Other Vinegar.  Strong spices. This includes black pepper, white pepper, red pepper, cayenne, curry powder, cloves, ginger, and chili powder. The items listed above may not be a complete list of foods and drinks to avoid. Contact your dietitian for more information. This information is not intended to replace advice given to you by your health care provider. Make sure you discuss any questions you have with your health care provider. Document Released: 02/13/2012 Document Revised: 01/20/2016 Document Reviewed: 06/18/2013 Elsevier Interactive Patient Education  [employer].   If you have lab work done today you will be contacted with your lab results within the next 2 weeks.  If you have not heard from [place] then please contact us. The fastest way to get your results is to register for My Chart.   IF you received an x-ray today, you will receive an invoice from [facility]. Please contact [facility] at [phone] with questions or concerns regarding your invoice.   IF you received labwork today, you will receive an invoice from [facility]. Please contact [facility] at [phone] with questions or concerns regarding your invoice.   Our billing staff will not be able to assist you with questions regarding bills from these companies.  You will be contacted with the lab results as soon as they are available. The fastest way to get your results is to activate your My Chart account. Instructions are located on the last page of this paperwork. If you have not heard from [place] regarding the results in 2 weeks, please contact this office.

## 2018-07-15 ENCOUNTER — Encounter: Payer: Self-pay | Admitting: Family Medicine

## 2018-07-30 DIAGNOSIS — F411 Generalized anxiety disorder: Secondary | ICD-10-CM | POA: Diagnosis not present

## 2018-07-30 DIAGNOSIS — F900 Attention-deficit hyperactivity disorder, predominantly inattentive type: Secondary | ICD-10-CM | POA: Diagnosis not present

## 2018-10-10 ENCOUNTER — Encounter: Payer: BLUE CROSS/BLUE SHIELD | Admitting: Family Medicine

## 2018-12-16 ENCOUNTER — Telehealth: Payer: Self-pay | Admitting: Oncology

## 2018-12-16 NOTE — Telephone Encounter (Signed)
R/s appt per 4/20 sch message - unable to leave message or reach patient . Mailed letter.

## 2019-01-10 ENCOUNTER — Other Ambulatory Visit: Payer: BLUE CROSS/BLUE SHIELD

## 2019-01-10 ENCOUNTER — Ambulatory Visit: Payer: BLUE CROSS/BLUE SHIELD | Admitting: Oncology

## 2019-04-03 ENCOUNTER — Telehealth: Payer: Self-pay | Admitting: Oncology

## 2019-04-03 NOTE — Telephone Encounter (Signed)
Per 8/6 schedule message moved 8/14 lab/fu to AM. Not able to reach patient or leave message. Schedule mailed.

## 2019-04-11 ENCOUNTER — Inpatient Hospital Stay: Payer: BLUE CROSS/BLUE SHIELD | Admitting: Oncology

## 2019-04-11 ENCOUNTER — Other Ambulatory Visit: Payer: BLUE CROSS/BLUE SHIELD

## 2019-04-11 ENCOUNTER — Inpatient Hospital Stay: Payer: Self-pay

## 2019-04-11 ENCOUNTER — Ambulatory Visit: Payer: BLUE CROSS/BLUE SHIELD | Admitting: Oncology

## 2019-04-14 ENCOUNTER — Telehealth: Payer: Self-pay | Admitting: Oncology

## 2019-04-14 NOTE — Telephone Encounter (Signed)
Unable to reach pt per 8/14 sch message - unable to leave message . Rescheduled appt and sent letter in the mail .

## 2019-05-07 ENCOUNTER — Inpatient Hospital Stay: Payer: Self-pay | Admitting: Oncology

## 2019-05-07 ENCOUNTER — Inpatient Hospital Stay: Payer: Self-pay | Attending: Oncology

## 2020-07-15 ENCOUNTER — Telehealth: Payer: Self-pay | Admitting: Emergency Medicine

## 2020-07-15 ENCOUNTER — Telehealth: Payer: Self-pay | Admitting: Family Medicine

## 2020-07-15 DIAGNOSIS — J329 Chronic sinusitis, unspecified: Secondary | ICD-10-CM

## 2020-07-15 MED ORDER — IPRATROPIUM BROMIDE 0.03 % NA SOLN: 2.0000 | Freq: Two times a day (BID) | NASAL | 0 refills | Status: DC

## 2020-07-15 NOTE — Progress Notes (Signed)
We are sorry that you are not feeling well.  Here is how we plan to help!  Based on what you have shared with me it looks like you have sinusitis.  Sinusitis is inflammation and infection in the sinus cavities of the head.  Based on your presentation I believe you most likely have Acute Viral Sinusitis.This is an infection most likely caused by a virus. There is not specific treatment for viral sinusitis other than to help you with the symptoms until the infection runs its course.  You may use an oral decongestant such as Mucinex D or if you have glaucoma or high blood pressure use plain Mucinex. Saline nasal spray help and can safely be used as often as needed for congestion, I have prescribed: Ipratropium Bromide nasal spray 0.03% 2 sprays in eah nostril 2-3 times a day  Some authorities believe that zinc sprays or the use of Echinacea may shorten the course of your symptoms.  Sinus infections are not as easily transmitted as other respiratory infection, however we still recommend that you avoid close contact with loved ones, especially the very young and elderly.  Remember to wash your hands thoroughly throughout the day as this is the number one way to prevent the spread of infection!  Providers prescribe antibiotics to treat infections caused by bacteria. Antibiotics are very powerful in treating bacterial infections when they are used properly. To maintain their effectiveness, they should be used only when necessary. Overuse of antibiotics has resulted in the development of superbugs that are resistant to treatment!    After careful review of your answers, I would not recommend an antibiotic for your condition.  Antibiotics are not effective against viruses and therefore should not be used to treat them. Common examples of infections caused by viruses include colds and flu    Home Care:  Only take medications as instructed by your medical team.  Do not take these medications with  alcohol.  A steam or ultrasonic humidifier can help congestion.  You can place a towel over your head and breathe in the steam from hot water coming from a faucet.  Avoid close contacts especially the very young and the elderly.  Cover your mouth when you cough or sneeze.  Always remember to wash your hands.  Get Help Right Away If:  You develop worsening fever or sinus pain.  You develop a severe head ache or visual changes.  Your symptoms persist after you have completed your treatment plan.  Make sure you  Understand these instructions.  Will watch your condition.  Will get help right away if you are not doing well or get worse.  Your e-visit answers were reviewed by a board certified advanced clinical practitioner to complete your personal care plan.  Depending on the condition, your plan could have included both over the counter or prescription medications.  If there is a problem please reply  once you have received a response from your provider.  Your safety is important to [place].  If you have drug allergies check your prescription carefully.    You can use MyChart to ask questions about today's visit, request a non-urgent call back, or ask for a work or school excuse for 24 hours related to this e-Visit. If it has been greater than 24 hours you will need to follow up with your provider, or enter a new e-Visit to address those concerns.  You will get an e-mail in the next two days asking about your experience.  I hope that your e-visit has been valuable and will speed your recovery. Thank you for using e-visits.   Approximately 5 minutes was used in reviewing the patient's chart, questionnaire, prescribing medications, and documentation.

## 2021-12-01 ENCOUNTER — Ambulatory Visit: Payer: Self-pay | Admitting: Podiatry

## 2024-04-04 ENCOUNTER — Ambulatory Visit: Payer: Self-pay | Admitting: Family Medicine

## 2024-04-04 ENCOUNTER — Encounter: Payer: Self-pay | Admitting: Family Medicine

## 2024-04-04 VITALS — BP 130/86 | HR 75 | Temp 98.5°F | Ht 73.0 in | Wt 250.6 lb

## 2024-04-04 DIAGNOSIS — R030 Elevated blood-pressure reading, without diagnosis of hypertension: Secondary | ICD-10-CM

## 2024-04-04 DIAGNOSIS — K219 Gastro-esophageal reflux disease without esophagitis: Secondary | ICD-10-CM | POA: Diagnosis not present

## 2024-04-04 DIAGNOSIS — J302 Other seasonal allergic rhinitis: Secondary | ICD-10-CM | POA: Insufficient documentation

## 2024-04-04 DIAGNOSIS — Z1211 Encounter for screening for malignant neoplasm of colon: Secondary | ICD-10-CM

## 2024-04-04 DIAGNOSIS — J301 Allergic rhinitis due to pollen: Secondary | ICD-10-CM

## 2024-04-04 DIAGNOSIS — Z13220 Encounter for screening for lipoid disorders: Secondary | ICD-10-CM

## 2024-04-04 DIAGNOSIS — Z1159 Encounter for screening for other viral diseases: Secondary | ICD-10-CM

## 2024-04-04 LAB — BASIC METABOLIC PANEL WITH GFR
BUN: 14 mg/dL (ref 6–23)
CO2: 27 meq/L (ref 19–32)
Calcium: 9.5 mg/dL (ref 8.4–10.5)
Chloride: 103 meq/L (ref 96–112)
Creatinine, Ser: 0.94 mg/dL (ref 0.40–1.50)
GFR: 96.66 mL/min (ref >60.00)
Glucose, Bld: 100 mg/dL — ABNORMAL HIGH (ref 70–99)
Potassium: 4 meq/L (ref 3.5–5.1)
Sodium: 140 meq/L (ref 135–145)

## 2024-04-04 LAB — LIPID PANEL
Cholesterol: 214 mg/dL — ABNORMAL HIGH (ref 0–200)
HDL: 36.1 mg/dL — ABNORMAL LOW (ref >39.00)
LDL Cholesterol: 151 mg/dL — ABNORMAL HIGH (ref 0–99)
NonHDL: 178.35
Total CHOL/HDL Ratio: 6
Triglycerides: 139 mg/dL (ref 0.0–149.0)
VLDL: 27.8 mg/dL (ref 0.0–40.0)

## 2024-04-04 NOTE — Assessment & Plan Note (Signed)
 Discussed a trial of step-down therapy with Pepcid and then stopping after a month. If heartburn surges, then resume PPI.

## 2024-04-04 NOTE — Progress Notes (Signed)
 [facility] [address] [place] [address] Dept: [phone] Dept Fax: [phone]  New Patient Office Visit  Subjective:    Patient ID: Clifford Davis, male    DOB: 29-Oct-1975, 48 y.o..   MRN: [id number]  Chief Complaint  Patient presents with   New Patient (Initial Visit)    Patient is wondering about being able to have a physical done. States he is getting older and wants to make sure he is in good health.    History of Present Illness:  Patient is in today to establish care. Clifford Davis was born in [place]. He attended [employer] [address], majoring in Chief Strategy Officer systems. 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 He has been married for 11 years. He has a son (7) and a step-daughter (22). Clifford Davis denies use of tobacco, alcohol, or drugs.  Clifford Davis has a history of seasonal allergic rhinitis. he manages this with cetirizine as needed.  Clifford Davis has a history of GERD. He manages this with an OTC PPI. He notes if he misses his meds for 2 days, he often gets a surge of heartburn.  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   Past Surgical History:  Procedure Laterality Date   ORCHIECTOMY Left 12/02/2012   Procedure: ORCHIECTOMY;  Surgeon: Toribio Neysa Repine, MD;  Location: [facility];  Service: Urology;  Laterality: Left;  1HR LEFT RADICAL INGUINAL ORCHIECTOMY    ORIF LEFT WRIST DISTAL RADIAL FX'S  10-03-2007   Family History  Problem Relation Age of Onset   Asthma Mother    Hyperlipidemia Father    Cancer Maternal Aunt        stomach ca    Cancer Maternal Grandfather        lung   Cancer Paternal Grandmother        Breast   Heart disease Paternal Grandfather    Outpatient Medications Prior to Visit  Medication Sig Dispense Refill   aspirin 81 MG tablet Take 81 mg by mouth daily.     cetirizine (ZYRTEC) 10 MG tablet Take 10 mg by mouth daily.     esomeprazole (NEXIUM) 40 MG capsule Take 40 mg by mouth daily at 12 noon.     ibuprofen (ADVIL,MOTRIN) 200 MG tablet Take 200 mg by mouth every 6 (six) hours as needed for pain.     ipratropium (ATROVENT ) 0.03 % nasal spray Place 2 sprays into both nostrils every 12 (twelve) hours. 30 mL 0   FLUoxetine (PROZAC) 20 MG tablet Take 20 mg by mouth daily. (Patient not taking: Reported on 04/04/2024)     No facility-administered medications prior to visit.   Allergies  Allergen Reactions   Bee Venom Anaphylaxis   Objective:   Today's Vitals   04/04/24 1000  BP: 130/86  Pulse: 75  Temp: 98.5 F (36.9 C)  TempSrc: Oral  SpO2: 97%  Weight: 250 lb 9.6 oz (113.7 kg)  Height: 6' 1 (1.854 m)   Body mass index is 33.06 kg/m.   General: Well developed, well nourished. No acute distress. Psych: Alert and oriented. Normal mood and affect.  Health Maintenance Due  Topic Date Due  HIV Screening  Never done   Hepatitis C Screening  Never done   Hepatitis B Vaccines (1 of 3 - 19+ 3-dose series) Never done   Colonoscopy  Never done   INFLUENZA VACCINE  03/28/2024     Assessment & Plan:   Problem List Items Addressed This Visit       Respiratory   Seasonal allergic rhinitis   Stable. Continue to manage with cetirizine 10 mg daily PRN.        Digestive   GERD (gastroesophageal reflux disease) - Primary   Discussed a trial of step-down therapy with Pepcid and then stopping after a month. If heartburn surges, then resume PPI.        Other   Elevated blood pressure reading without diagnosis of hypertension   I will check some screening labs for potential causes of  elevated blood pressure. I encouraged him to engage in regular exercise and look at losing 10+ lbs of weight. I will have him back for a physical and will reassess his BP at that point.      Relevant Orders   Basic metabolic panel with GFR   Other Visit Diagnoses       Screening for lipid disorders       Relevant Orders   Lipid panel     Encounter for hepatitis C screening test for low risk patient       Relevant Orders   HCV Ab w Reflex to Quant PCR     Screening for colon cancer       Relevant Orders   Ambulatory referral to Gastroenterology       Return in about 4 weeks (around 05/02/2024) for Annual preventative care.   Garnette CHRISTELLA Simpler, MD

## 2024-04-04 NOTE — Assessment & Plan Note (Signed)
 Stable. Continue to manage with cetirizine 10 mg daily PRN.

## 2024-04-04 NOTE — Assessment & Plan Note (Signed)
 I will check some screening labs for potential causes of elevated blood pressure. I encouraged him to engage in regular exercise and look at losing 10+ lbs of weight. I will have him back for a physical and will reassess his BP at that point.

## 2024-04-05 LAB — HCV AB W REFLEX TO QUANT PCR: HCV Ab: NONREACTIVE

## 2024-04-05 LAB — HCV INTERPRETATION

## 2024-06-27 ENCOUNTER — Ambulatory Visit: Admitting: Family Medicine

## 2024-06-27 ENCOUNTER — Encounter: Payer: Self-pay | Admitting: Family Medicine

## 2024-06-27 VITALS — BP 132/80 | HR 73 | Temp 98.1°F | Ht 73.0 in | Wt 251.6 lb

## 2024-06-27 DIAGNOSIS — Z0000 Encounter for general adult medical examination without abnormal findings: Secondary | ICD-10-CM | POA: Diagnosis not present

## 2024-06-27 DIAGNOSIS — K219 Gastro-esophageal reflux disease without esophagitis: Secondary | ICD-10-CM

## 2024-06-27 DIAGNOSIS — J301 Allergic rhinitis due to pollen: Secondary | ICD-10-CM

## 2024-06-27 DIAGNOSIS — R030 Elevated blood-pressure reading, without diagnosis of hypertension: Secondary | ICD-10-CM | POA: Diagnosis not present

## 2024-06-27 DIAGNOSIS — E785 Hyperlipidemia, unspecified: Secondary | ICD-10-CM

## 2024-06-27 DIAGNOSIS — Z23 Encounter for immunization: Secondary | ICD-10-CM | POA: Diagnosis not present

## 2024-06-27 DIAGNOSIS — E6609 Other obesity due to excess calories: Secondary | ICD-10-CM | POA: Insufficient documentation

## 2024-06-27 NOTE — Assessment & Plan Note (Signed)
 Your total cholesterol and LDL (bad) cholesterol are borderline elevated. AHA/ACC cardiovascular risk score shows a 4.2 % (low) risk of a MI or stroke in the next 10 years. I recommend eating a heart-healthy diet (DASH diet or Mediterranean), getting 150 minutes of moderate-intensity exercise each week, achieving a normal weight, and avoiding tobacco products.

## 2024-06-27 NOTE — Assessment & Plan Note (Signed)
 Stable. Reinforced small, sustainable changes in diet and exercise with an overall goal of a 10% weight loss. If not achieving goals, consider referral to [facility].

## 2024-06-27 NOTE — Assessment & Plan Note (Addendum)
 Improved with use of esomeprazole (Nexium).

## 2024-06-27 NOTE — Progress Notes (Signed)
 [facility] [address] [place] [address] Dept: [phone] Dept Fax: [phone]  Annual Physical Visit  Subjective:    Patient ID: Clifford Davis, male    DOB: [birth year], 48 y.o..   MRN: [id number]  Chief Complaint  Patient presents with   Annual Exam    CPE/labs.  No concerns.     History of Present Illness:  Patient is in today for an annual physical/preventative visit.  Mr. Hoare has a history of seasonal allergic rhinitis. He manages this with cetirizine as needed.   Mr. Klump has a history of GERD. He is now on esomeprazole (Nexium). He feels this is managing better.  At his last visit, we did lipid testing. Mr. Carbin has a borderline elevated cholesterol. He also has had borderline elevated blood pressures. He has now implemented aspects of a DASH diet and is starting to exercise more.  Review of Systems  Constitutional:  Negative for chills, diaphoresis, fever, malaise/fatigue and weight loss.  HENT:  Positive for congestion. Negative for ear pain, hearing loss, sinus pain, sore throat and tinnitus.        Nasal congestion related to allergies and a recent viral URI.  Eyes:  Negative for blurred vision, pain, discharge and redness.  Respiratory:  Positive for cough. Negative for shortness of breath and wheezing.        Minor cough related to a recent URI  Cardiovascular:  Negative for chest pain and palpitations.  Gastrointestinal:  Positive for heartburn. Negative for abdominal pain, constipation, diarrhea, nausea and vomiting.       Heartburn improved as noted above.  Musculoskeletal:  Positive for joint pain. Negative for back pain and myalgias.       Occasional popping of knees and pain with deep squats. Also some ankle and foot pain with prolonged standing at work.  Skin:  Negative for itching and rash.  Psychiatric/Behavioral:  Negative for depression. The patient is not nervous/anxious.    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   Past Surgical History:  Procedure Laterality Date   ORCHIECTOMY Left 12/02/2012   Procedure: ORCHIECTOMY;  Surgeon: Toribio Neysa Repine, MD;  Location: [facility];  Service: Urology;  Laterality: Left;  1HR LEFT RADICAL INGUINAL ORCHIECTOMY    ORIF LEFT WRIST DISTAL RADIAL FX'S  10-03-2007   Family History  Problem Relation Age of Onset   Asthma Mother    Hyperlipidemia Father    Cancer Maternal Aunt        stomach ca   Cancer Maternal Grandfather        lung   Cancer Paternal Grandmother        Breast   Heart disease Paternal Grandfather    Outpatient Medications Prior to Visit  Medication Sig Dispense Refill   cetirizine (ZYRTEC) 10 MG tablet Take 10 mg by mouth daily.     esomeprazole (NEXIUM) 40 MG capsule Take 40 mg by mouth daily at 12 noon.     No facility-administered medications prior to visit.   Allergies  Allergen Reactions   Bee Venom Anaphylaxis   Objective:   There were no vitals filed for this visit. There is no  height or weight on file to calculate BMI.   General: Well developed, well nourished. No acute distress. HEENT: Normocephalic, non-traumatic. PERRL, EOMI. Conjunctiva clear. External ears normal. EAC and TMs normal bilaterally. Nose    clear without congestion or rhinorrhea. Mucous membranes moist. Oropharynx clear. Good dentition. Neck: Supple. No lymphadenopathy. No thyromegaly. Lungs: Clear to auscultation bilaterally. No wheezing, rales or rhonchi. CV: RRR without murmurs or rubs. Pulses 2+ bilaterally. Abdomen: Soft, non-tender. Bowel sounds positive, normal pitch and  frequency. No hepatosplenomegaly. No rebound or guarding. Extremities: Full ROM. No joint swelling or tenderness. No edema noted. Skin: Warm and dry. No rashes. Psych: Alert and oriented. Normal mood and affect.  Health Maintenance Due  Topic Date Due   COVID-19 Vaccine (1) Never done   HIV Screening  Never done   Hepatitis B Vaccines 19-59 Average Risk (1 of 3 - 19+ 3-dose series) Never done   Colonoscopy  Never done   Influenza Vaccine  03/28/2024   Lab Results    Latest Ref Rng & Units 04/04/2024   10:41 AM 01/11/2018    8:09 AM 11/20/2016    8:53 AM  CMP  Glucose 70 - 99 mg/dL 899  897  897   BUN 6 - 23 mg/dL 14  15  83.2   Creatinine 0.40 - 1.50 mg/dL 9.05  9.09  1.0   Sodium 135 - 145 mEq/L 140  140  141   Potassium 3.5 - 5.1 mEq/L 4.0  3.9  4.0   Chloride 96 - 112 mEq/L 103  106    CO2 19 - 32 mEq/L 27  25  25    Calcium 8.4 - 10.5 mg/dL 9.5  9.5  9.8   Total Protein 6.4 - 8.3 g/dL  7.3  6.9   Total Bilirubin 0.2 - 1.2 mg/dL  0.4  9.43   Alkaline Phos 40 - 150 U/L  75  70   AST 5 - 34 U/L  23  17   ALT 0 - 55 U/L  47  43    Last lipids Lab Results  Component Value Date   CHOL 214 (H) 04/04/2024   HDL 36.10 (L) 04/04/2024   LDLCALC 151 (H) 04/04/2024   TRIG 139.0 04/04/2024   CHOLHDL 6 04/04/2024   Assessment & Plan:   Problem List Items Addressed This Visit       Respiratory   Seasonal allergic rhinitis   Stable. Continue to manage with cetirizine 10 mg daily PRN. Consider adding a daily nasal steroid when antihistamines are not enough to control symptoms.        Digestive   GERD (gastroesophageal reflux disease)   Improved with use of esomeprazole (Nexium).        Other   Annual physical exam - Primary   Overall health is godd. Recommend regular exercise. Discussed recommended screenings and immunizations.       Borderline hyperlipidemia   Your total cholesterol and LDL (bad) cholesterol are borderline elevated. AHA/ACC cardiovascular risk score  shows a 4.2 % (low) risk of a MI or stroke in the next 10 years. I recommend eating a heart-healthy diet (DASH diet or Mediterranean), getting 150 minutes of moderate-intensity exercise each week, achieving a normal weight, and avoiding tobacco products.       Elevated blood pressure reading without diagnosis of hypertension   Stable. Reinforced small, sustainable changes in diet and exercise with an overall goal of a 10% weight loss. If not achieving goals, consider referral to [facility].      Other Visit Diagnoses       Need for immunization against influenza       Relevant Orders   Flu vaccine trivalent PF, 6mos and older(Flulaval,Afluria,Fluarix,Fluzone)       Return in about 6 months (around 12/25/2024) for Reassessment.   Garnette CHRISTELLA Simpler, MD  I,Emily Lagle,acting as a scribe for Garnette CHRISTELLA Simpler, MD.,have documented all relevant documentation on the behalf of Garnette CHRISTELLA Simpler, MD.  I, Garnette CHRISTELLA Simpler, MD, have reviewed all documentation for this visit. The documentation on 06/27/2024 for the exam, diagnosis, procedures, and orders are all accurate and complete.

## 2024-06-27 NOTE — Assessment & Plan Note (Signed)
 Overall health is godd. Recommend regular exercise. Discussed recommended screenings and immunizations.

## 2024-06-27 NOTE — Assessment & Plan Note (Addendum)
 Stable. Continue to manage with cetirizine 10 mg daily PRN. Consider adding a daily nasal steroid when antihistamines are not enough to control symptoms.

## 2024-12-25 ENCOUNTER — Ambulatory Visit: Admitting: Family Medicine
